# Patient Record
Sex: Female | Born: 1985
Health system: Southern US, Community
[De-identification: ages and names within clinical notes are randomized; demographics above are authoritative.]

## PROBLEM LIST (undated history)

## (undated) ENCOUNTER — Inpatient Hospital Stay (HOSPITAL_COMMUNITY): Payer: Self-pay

## (undated) DIAGNOSIS — T4145XA Adverse effect of unspecified anesthetic, initial encounter: Secondary | ICD-10-CM

## (undated) DIAGNOSIS — T8859XA Other complications of anesthesia, initial encounter: Secondary | ICD-10-CM

## (undated) DIAGNOSIS — F418 Other specified anxiety disorders: Secondary | ICD-10-CM

## (undated) DIAGNOSIS — G43909 Migraine, unspecified, not intractable, without status migrainosus: Secondary | ICD-10-CM

## (undated) DIAGNOSIS — R87629 Unspecified abnormal cytological findings in specimens from vagina: Secondary | ICD-10-CM

## (undated) HISTORY — DX: Migraine, unspecified, not intractable, without status migrainosus: G43.909

## (undated) HISTORY — PX: WISDOM TOOTH EXTRACTION: SHX21

## (undated) HISTORY — DX: Other specified anxiety disorders: F41.8

---

## 2013-11-30 LAB — OB RESULTS CONSOLE ABO/RH: RH TYPE: POSITIVE

## 2013-11-30 LAB — OB RESULTS CONSOLE ANTIBODY SCREEN: Antibody Screen: NEGATIVE

## 2013-11-30 LAB — OB RESULTS CONSOLE RUBELLA ANTIBODY, IGM: Rubella: IMMUNE

## 2013-11-30 LAB — OB RESULTS CONSOLE GC/CHLAMYDIA
CHLAMYDIA, DNA PROBE: NEGATIVE
Gonorrhea: NEGATIVE

## 2013-11-30 LAB — OB RESULTS CONSOLE HEPATITIS B SURFACE ANTIGEN: Hepatitis B Surface Ag: NEGATIVE

## 2013-11-30 LAB — OB RESULTS CONSOLE HIV ANTIBODY (ROUTINE TESTING): HIV: NONREACTIVE

## 2013-12-28 NOTE — L&D Delivery Note (Signed)
Patient was C/C/+1 and pushed for 60 minutes with epidural.   NSVD female infant, Apgars 9/9, weight pending.   The patient had no lacerations. Fundus was firm. EBL was expected. Placenta was delivered intact. Vagina was clear.  Baby was vigorous and doing skin to skin with mother.  Allyn Kenner

## 2014-05-09 ENCOUNTER — Encounter (HOSPITAL_COMMUNITY): Payer: Self-pay | Admitting: *Deleted

## 2014-05-09 ENCOUNTER — Inpatient Hospital Stay (HOSPITAL_COMMUNITY)
Admission: AD | Admit: 2014-05-09 | Discharge: 2014-05-09 | Disposition: A | Discharge status: Home / Self Care | Payer: BC Managed Care – PPO | Source: Ambulatory Visit | Attending: Obstetrics & Gynecology | Admitting: Obstetrics & Gynecology

## 2014-05-09 DIAGNOSIS — O4700 False labor before 37 completed weeks of gestation, unspecified trimester: Secondary | ICD-10-CM | POA: Insufficient documentation

## 2014-05-09 HISTORY — DX: Unspecified abnormal cytological findings in specimens from vagina: R87.629

## 2014-05-09 HISTORY — DX: Other complications of anesthesia, initial encounter: T88.59XA

## 2014-05-09 HISTORY — DX: Adverse effect of unspecified anesthetic, initial encounter: T41.45XA

## 2014-05-09 LAB — URINALYSIS, ROUTINE W REFLEX MICROSCOPIC
Bilirubin Urine: NEGATIVE
GLUCOSE, UA: NEGATIVE mg/dL
Hgb urine dipstick: NEGATIVE
Ketones, ur: NEGATIVE mg/dL
LEUKOCYTES UA: NEGATIVE
NITRITE: NEGATIVE
PROTEIN: NEGATIVE mg/dL
Specific Gravity, Urine: 1.015 (ref 1.005–1.030)
Urobilinogen, UA: 0.2 mg/dL (ref 0.0–1.0)
pH: 7 (ref 5.0–8.0)

## 2014-05-09 MED ORDER — OXYCODONE-ACETAMINOPHEN 5-325 MG PO TABS
1.0000 | ORAL_TABLET | Freq: Once | ORAL | Status: AC
  Administered 2014-05-09: 1 via ORAL
  Filled 2014-05-09: qty 1

## 2014-05-09 MED ORDER — NIFEDIPINE 10 MG PO CAPS
10.0000 mg | ORAL_CAPSULE | Freq: Once | ORAL | Status: AC
  Administered 2014-05-09: 10 mg via ORAL
  Filled 2014-05-09: qty 1

## 2014-05-09 MED ORDER — OXYCODONE-ACETAMINOPHEN 5-325 MG PO TABS: 2.0000 | ORAL_TABLET | ORAL | Status: DC | PRN

## 2014-05-09 MED ORDER — NIFEDIPINE 10 MG PO CAPS: 10.0000 mg | ORAL_CAPSULE | ORAL | Status: DC | PRN

## 2014-05-09 MED ORDER — PROMETHAZINE HCL 25 MG PO TABS
25.0000 mg | ORAL_TABLET | Freq: Once | ORAL | Status: AC
  Administered 2014-05-09: 25 mg via ORAL
  Filled 2014-05-09: qty 1

## 2014-05-09 MED ORDER — HYDROXYZINE HCL 25 MG PO TABS: 25.0000 mg | ORAL_TABLET | Freq: Four times a day (QID) | ORAL | Status: DC

## 2014-05-09 NOTE — MAU Provider Note (Signed)
Discussed case with NP and I agree with above  Sanjuana Kava

## 2014-05-09 NOTE — MAU Note (Signed)
States she has been having contractions for weeks, but usually they stop. Today, contractions started this AM around 0800 and have not subsided. States contractions do not hurt that bad, rates a "3."

## 2014-05-09 NOTE — MAU Provider Note (Signed)
  History     CSN: [id number]  Arrival date and time: 05/09/14 1508   First Provider Initiated Contact with Patient 05/09/14 1600      Chief Complaint  Patient presents with  . Contractions   HPI Comments: Lulie Hurd 28 y.o. [redacted]w[redacted]d  [id number] presents to MAU with contractions every 3-5 minutes since 8 am. She has been having them off and on for days. 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 She denies any LOF or vaginal bleeding.      Past Medical History  Diagnosis Date  . Vaginal Pap smear, abnormal   . Complication of anesthesia     ? "problem with epidural"  back pain    Past Surgical History  Procedure Laterality Date  . No past surgeries      History reviewed. No pertinent family history.  History  Substance Use Topics  . Smoking status: Never Smoker   . Smokeless tobacco: Never Used  . Alcohol Use: No    Allergies: No Known Allergies  No prescriptions prior to admission    Review of Systems  Constitutional: Negative.   HENT: Negative.   Eyes: Negative.   Respiratory: Negative.   Cardiovascular: Negative.   Gastrointestinal: Positive for abdominal pain.  Genitourinary: Negative.   Musculoskeletal: Negative.   Skin: Negative.   Neurological: Negative.   Psychiatric/Behavioral: Negative.    Physical Exam   Blood pressure 139/83, pulse 100, temperature 98.8 F (37.1 C), temperature source Oral, resp. rate 18.  Physical Exam  Constitutional: She is oriented to person, place, and time. She appears well-developed and well-nourished. No distress.  HENT:  Head: Normocephalic and atraumatic.  Eyes: Pupils are equal, round, and reactive to light.  GI: Soft. There is tenderness.  Genitourinary:  Cervix closed and 50 %  Neurological: She is alert and oriented to person, place, and time.  Skin: Skin is warm and dry.  Psychiatric: She has a normal mood and affect. Her behavior is normal. Judgment and thought content normal.    MAU  Course  Procedures  MDM  Percocet/ phenergan po/ not helpful Spoke with Dr Alwyn Pea who advised GBS and give patient option of Procardia and discharged to home or admit and IVF and stadol. Patient asked if we could do one dose of procardia and see if it helped. Patient decided she would go home with procardia. Discussed again with Dr Alwyn Pea who advised she could take procardia 20 mg q30 min to 1 hour for preterm labor. She is to call office tomorrow for visit and possible NST.     Assessment and Plan   A: Preterm labor  P: Procardia 10 mg two tabs q30 min to 1 hours to settle down contractions Vistaril 25 mg po q6 hours prn  Percocet 5-325 mg for pain # 6   Call office tomorrow for visit and possible NST  Olegario Messier 05/09/2014, 4:10 PM

## 2014-05-09 NOTE — Discharge Instructions (Signed)
Preterm Labor Information Preterm labor is when labor starts at less than 37 weeks of pregnancy. The normal length of a pregnancy is 39 to 41 weeks. CAUSES Often, there is no identifiable underlying cause as to why a woman goes into preterm labor. One of the most common known causes of preterm labor is infection. Infections of the uterus, cervix, vagina, amniotic sac, bladder, kidney, or even the lungs (pneumonia) can cause labor to start. Other suspected causes of preterm labor include:   Urogenital infections, such as yeast infections and bacterial vaginosis.   Uterine abnormalities (uterine shape, uterine septum, fibroids, or bleeding from the placenta).   A cervix that has been operated on (it may fail to stay closed).   Malformations in the fetus.   Multiple gestations (twins, triplets, and so on).   Breakage of the amniotic sac.  RISK FACTORS  Having a previous history of preterm labor.   Having premature rupture of membranes (PROM).   Having a placenta that covers the opening of the cervix (placenta previa).   Having a placenta that separates from the uterus (placental abruption).   Having a cervix that is too weak to hold the fetus in the uterus (incompetent cervix).   Having too much fluid in the amniotic sac (polyhydramnios).   Taking illegal drugs or smoking while pregnant.   Not gaining enough weight while pregnant.   Being younger than 18 and older than 28 years old.   Having a low socioeconomic status.   Being African American. SYMPTOMS Signs and symptoms of preterm labor include:   Menstrual-like cramps, abdominal pain, or back pain.  Uterine contractions that are regular, as frequent as six in an hour, regardless of their intensity (may be mild or painful).  Contractions that start on the top of the uterus and spread down to the lower abdomen and back.   A sense of increased pelvic pressure.   A watery or bloody mucus discharge that  comes from the vagina.  TREATMENT Depending on the length of the pregnancy and other circumstances, your health care provider may suggest bed rest. If necessary, there are medicines that can be given to stop contractions and to mature the fetal lungs. If labor happens before 34 weeks of pregnancy, a prolonged hospital stay may be recommended. Treatment depends on the condition of both you and the fetus.  WHAT SHOULD YOU DO IF YOU THINK YOU ARE IN PRETERM LABOR? Call your health care provider right away. You will need to go to the hospital to get checked immediately. HOW CAN YOU PREVENT PRETERM LABOR IN FUTURE PREGNANCIES? You should:   Stop smoking if you smoke.  Maintain healthy weight gain and avoid chemicals and drugs that are not necessary.  Be watchful for any type of infection.  Inform your health care provider if you have a known history of preterm labor. Document Released: 03/05/2004 Document Revised: 08/16/2013 Document Reviewed: 01/16/2013 [employer] Patient Information 2014 [employer].    

## 2014-05-12 LAB — CULTURE, BETA STREP (GROUP B ONLY)

## 2014-05-22 LAB — OB RESULTS CONSOLE GBS: STREP GROUP B AG: NEGATIVE

## 2014-06-14 ENCOUNTER — Inpatient Hospital Stay (HOSPITAL_COMMUNITY)
Admission: AD | Admit: 2014-06-14 | Discharge: 2014-06-16 | DRG: 775 | Disposition: A | Discharge status: Home / Self Care | Payer: BC Managed Care – PPO | Source: Ambulatory Visit | Attending: Obstetrics and Gynecology | Admitting: Obstetrics and Gynecology

## 2014-06-14 ENCOUNTER — Encounter (HOSPITAL_COMMUNITY): Payer: Self-pay | Admitting: *Deleted

## 2014-06-14 DIAGNOSIS — Z3490 Encounter for supervision of normal pregnancy, unspecified, unspecified trimester: Secondary | ICD-10-CM

## 2014-06-14 LAB — CBC
HCT: 36.1 % (ref 36.0–46.0)
Hemoglobin: 12.1 g/dL (ref 12.0–15.0)
MCH: 27.2 pg (ref 26.0–34.0)
MCHC: 33.5 g/dL (ref 30.0–36.0)
MCV: 81.1 fL (ref 78.0–100.0)
PLATELETS: 227 10*3/uL (ref 150–400)
RBC: 4.45 MIL/uL (ref 3.87–5.11)
RDW: 13.5 % (ref 11.5–15.5)
WBC: 15.4 10*3/uL — ABNORMAL HIGH (ref 4.0–10.5)

## 2014-06-14 MED ORDER — PHENYLEPHRINE 40 MCG/ML (10ML) SYRINGE FOR IV PUSH (FOR BLOOD PRESSURE SUPPORT)
80.0000 ug | PREFILLED_SYRINGE | INTRAVENOUS | Status: DC | PRN
  Filled 2014-06-14: qty 10
  Filled 2014-06-14: qty 2

## 2014-06-14 MED ORDER — ZOLPIDEM TARTRATE 5 MG PO TABS: 5.0000 mg | ORAL_TABLET | Freq: Every evening | ORAL | Status: DC | PRN

## 2014-06-14 MED ORDER — BUTORPHANOL TARTRATE 1 MG/ML IJ SOLN
1.0000 mg | INTRAMUSCULAR | Status: DC | PRN
  Administered 2014-06-15: 1 mg via INTRAVENOUS
  Filled 2014-06-14: qty 1

## 2014-06-14 MED ORDER — OXYTOCIN BOLUS FROM INFUSION
500.0000 mL | INTRAVENOUS | Status: DC
Start: 2014-06-14 — End: 2014-06-15
  Administered 2014-06-15: 500 mL via INTRAVENOUS

## 2014-06-14 MED ORDER — EPHEDRINE 5 MG/ML INJ
10.0000 mg | INTRAVENOUS | Status: DC | PRN
  Filled 2014-06-14: qty 4
  Filled 2014-06-14: qty 2

## 2014-06-14 MED ORDER — OXYCODONE-ACETAMINOPHEN 5-325 MG PO TABS
1.0000 | ORAL_TABLET | ORAL | Status: DC | PRN
  Administered 2014-06-15: 1 via ORAL
  Filled 2014-06-14: qty 1

## 2014-06-14 MED ORDER — IBUPROFEN 600 MG PO TABS
600.0000 mg | ORAL_TABLET | Freq: Four times a day (QID) | ORAL | Status: DC | PRN
  Administered 2014-06-15: 600 mg via ORAL
  Filled 2014-06-14: qty 1

## 2014-06-14 MED ORDER — ACETAMINOPHEN 325 MG PO TABS: 650.0000 mg | ORAL_TABLET | ORAL | Status: DC | PRN

## 2014-06-14 MED ORDER — FENTANYL 2.5 MCG/ML BUPIVACAINE 1/10 % EPIDURAL INFUSION (WH - ANES)
14.0000 mL/h | INTRAMUSCULAR | Status: DC | PRN
  Administered 2014-06-15: 14 mL/h via EPIDURAL
  Filled 2014-06-14 (×2): qty 125

## 2014-06-14 MED ORDER — EPHEDRINE 5 MG/ML INJ
10.0000 mg | INTRAVENOUS | Status: DC | PRN
  Filled 2014-06-14: qty 2

## 2014-06-14 MED ORDER — TERBUTALINE SULFATE 1 MG/ML IJ SOLN: 0.2500 mg | Freq: Once | INTRAMUSCULAR | Status: AC | PRN

## 2014-06-14 MED ORDER — PHENYLEPHRINE 40 MCG/ML (10ML) SYRINGE FOR IV PUSH (FOR BLOOD PRESSURE SUPPORT)
80.0000 ug | PREFILLED_SYRINGE | INTRAVENOUS | Status: DC | PRN
  Filled 2014-06-14: qty 2

## 2014-06-14 MED ORDER — LACTATED RINGERS IV SOLN
500.0000 mL | Freq: Once | INTRAVENOUS | Status: AC
  Administered 2014-06-15: 500 mL via INTRAVENOUS

## 2014-06-14 MED ORDER — CITRIC ACID-SODIUM CITRATE 334-500 MG/5ML PO SOLN: 30.0000 mL | ORAL | Status: DC | PRN

## 2014-06-14 MED ORDER — DIPHENHYDRAMINE HCL 50 MG/ML IJ SOLN: 12.5000 mg | INTRAMUSCULAR | Status: DC | PRN

## 2014-06-14 MED ORDER — LACTATED RINGERS IV SOLN: 500.0000 mL | INTRAVENOUS | Status: DC | PRN

## 2014-06-14 MED ORDER — ONDANSETRON HCL 4 MG/2ML IJ SOLN: 4.0000 mg | Freq: Four times a day (QID) | INTRAMUSCULAR | Status: DC | PRN

## 2014-06-14 MED ORDER — LACTATED RINGERS IV SOLN
INTRAVENOUS | Status: DC
  Administered 2014-06-15 (×2): via INTRAVENOUS

## 2014-06-14 MED ORDER — OXYTOCIN 40 UNITS IN LACTATED RINGERS INFUSION - SIMPLE MED: 62.5000 mL/h | INTRAVENOUS | Status: DC

## 2014-06-14 MED ORDER — MISOPROSTOL 25 MCG QUARTER TABLET
25.0000 ug | ORAL_TABLET | ORAL | Status: DC | PRN
  Administered 2014-06-14: 25 ug via VAGINAL
  Filled 2014-06-14: qty 1
  Filled 2014-06-14: qty 0.25

## 2014-06-14 MED ORDER — LIDOCAINE HCL (PF) 1 % IJ SOLN
30.0000 mL | INTRAMUSCULAR | Status: DC | PRN
  Filled 2014-06-14: qty 30

## 2014-06-14 NOTE — Progress Notes (Signed)
Dr. Ouida Sills notified that pt's prenatal record not in [place], requests secretary check in [place], no orders for OB panel at this time

## 2014-06-14 NOTE — H&P (Signed)
Nicole Meza is an 28 y.o.white female [id number] [redacted]w[redacted]d who was seen in the office today for a routine PN visit. She had an NST because she was 40+ wks. On the NST there was a late decel. Given her parity and gest age she was sent to the hosp for induction. PNC was otherwise uncomplicated. She had a normal first trimester screen. Chief Complaint: HPI:  Past Medical History  Diagnosis Date  . Vaginal Pap smear, abnormal   . Complication of anesthesia     ? "problem with epidural"  back pain    Past Surgical History  Procedure Laterality Date  . No past surgeries    . Wisdom tooth extraction      History reviewed. No pertinent family history. Social History:  reports that she has never smoked. She has never used smokeless tobacco. She reports that she does not drink alcohol or use illicit drugs.  Allergies: No Known Allergies  Medications Prior to Admission  Medication Sig Dispense Refill  . Prenatal Vit-Fe Fumarate-FA (PRENATAL MULTIVITAMIN) TABS tablet Take 1 tablet by mouth daily at 12 noon.           Blood pressure 128/79, pulse 85, temperature 98.1 F (36.7 C), temperature source Oral, resp. rate 16, height 5' 2.5" (1.588 m), weight 81.647 kg (180 lb). Abdomen: soft, non-tender; bowel sounds normal; no masses,  no organomegaly and Gravid Pelvic: Cx-50/1/-2 vtx   Lab Results  Component Value Date   WBC 15.4* 06/14/2014   HGB 12.1 06/14/2014   HCT 36.1 06/14/2014   MCV 81.1 06/14/2014   PLT 227 06/14/2014   No results found for this basename: PREGTESTUR, PREGSERUM, HCG, HCGQUANT     Assessment/Plan  Patient Active Problem List   Diagnosis Date Noted  . Pregnancy 06/14/2014  IMP/ IUP at term with late decel on NST Plan/ Will admit and start induction  Sherelle Castelli E 06/14/2014, 9:31 PM

## 2014-06-15 ENCOUNTER — Encounter (HOSPITAL_COMMUNITY): Payer: Self-pay | Admitting: Anesthesiology

## 2014-06-15 ENCOUNTER — Inpatient Hospital Stay (HOSPITAL_COMMUNITY): Payer: BC Managed Care – PPO | Admitting: Anesthesiology

## 2014-06-15 ENCOUNTER — Encounter (HOSPITAL_COMMUNITY): Payer: BC Managed Care – PPO | Admitting: Anesthesiology

## 2014-06-15 LAB — RPR

## 2014-06-15 MED ORDER — ONDANSETRON HCL 4 MG/2ML IJ SOLN: 4.0000 mg | INTRAMUSCULAR | Status: DC | PRN

## 2014-06-15 MED ORDER — PRENATAL MULTIVITAMIN CH
1.0000 | ORAL_TABLET | Freq: Every day | ORAL | Status: DC
  Administered 2014-06-16: 1 via ORAL
  Filled 2014-06-15: qty 1

## 2014-06-15 MED ORDER — OXYTOCIN 40 UNITS IN LACTATED RINGERS INFUSION - SIMPLE MED
1.0000 m[IU]/min | INTRAVENOUS | Status: DC
  Filled 2014-06-15: qty 1000

## 2014-06-15 MED ORDER — DIBUCAINE 1 % RE OINT: 1.0000 "application " | TOPICAL_OINTMENT | RECTAL | Status: DC | PRN

## 2014-06-15 MED ORDER — OXYCODONE-ACETAMINOPHEN 5-325 MG PO TABS
1.0000 | ORAL_TABLET | ORAL | Status: DC | PRN
  Administered 2014-06-15 – 2014-06-16 (×2): 1 via ORAL
  Filled 2014-06-15 (×2): qty 1

## 2014-06-15 MED ORDER — LIDOCAINE HCL (PF) 1 % IJ SOLN
INTRAMUSCULAR | Status: DC | PRN
  Administered 2014-06-15 (×2): 8 mL

## 2014-06-15 MED ORDER — SENNOSIDES-DOCUSATE SODIUM 8.6-50 MG PO TABS
2.0000 | ORAL_TABLET | ORAL | Status: DC
  Administered 2014-06-16: 2 via ORAL
  Filled 2014-06-15: qty 2

## 2014-06-15 MED ORDER — TERBUTALINE SULFATE 1 MG/ML IJ SOLN: 0.2500 mg | Freq: Once | INTRAMUSCULAR | Status: DC | PRN

## 2014-06-15 MED ORDER — ONDANSETRON HCL 4 MG PO TABS: 4.0000 mg | ORAL_TABLET | ORAL | Status: DC | PRN

## 2014-06-15 MED ORDER — ZOLPIDEM TARTRATE 5 MG PO TABS: 5.0000 mg | ORAL_TABLET | Freq: Every evening | ORAL | Status: DC | PRN

## 2014-06-15 MED ORDER — IBUPROFEN 600 MG PO TABS
600.0000 mg | ORAL_TABLET | Freq: Four times a day (QID) | ORAL | Status: DC
  Administered 2014-06-15 – 2014-06-16 (×4): 600 mg via ORAL
  Filled 2014-06-15 (×4): qty 1

## 2014-06-15 MED ORDER — FENTANYL 2.5 MCG/ML BUPIVACAINE 1/10 % EPIDURAL INFUSION (WH - ANES)
INTRAMUSCULAR | Status: DC | PRN
  Administered 2014-06-15: 14 mL/h via EPIDURAL

## 2014-06-15 MED ORDER — DIPHENHYDRAMINE HCL 25 MG PO CAPS: 25.0000 mg | ORAL_CAPSULE | Freq: Four times a day (QID) | ORAL | Status: DC | PRN

## 2014-06-15 MED ORDER — TETANUS-DIPHTH-ACELL PERTUSSIS 5-2.5-18.5 LF-MCG/0.5 IM SUSP
0.5000 mL | Freq: Once | INTRAMUSCULAR | Status: AC
  Administered 2014-06-16: 0.5 mL via INTRAMUSCULAR

## 2014-06-15 MED ORDER — WITCH HAZEL-GLYCERIN EX PADS: 1.0000 "application " | MEDICATED_PAD | CUTANEOUS | Status: DC | PRN

## 2014-06-15 MED ORDER — LANOLIN HYDROUS EX OINT: TOPICAL_OINTMENT | CUTANEOUS | Status: DC | PRN

## 2014-06-15 MED ORDER — BENZOCAINE-MENTHOL 20-0.5 % EX AERO: 1.0000 "application " | INHALATION_SPRAY | CUTANEOUS | Status: DC | PRN

## 2014-06-15 MED ORDER — SIMETHICONE 80 MG PO CHEW: 80.0000 mg | CHEWABLE_TABLET | ORAL | Status: DC | PRN

## 2014-06-15 NOTE — Anesthesia Postprocedure Evaluation (Signed)
Anesthesia Post Note  Patient: Nicole Meza  Procedure(s) Performed: * No procedures listed *  Anesthesia type: Epidural  Patient location: Mother/Baby  Post pain: Pain level controlled  Post assessment: Post-op Vital signs reviewed  Last Vitals:  Filed Vitals:   06/15/14 1145  BP: 117/66  Pulse: 87  Temp: 37.2 C  Resp: 19    Post vital signs: Reviewed  Level of consciousness:alert  Complications: No apparent anesthesia complications

## 2014-06-15 NOTE — Lactation Note (Signed)
This note was copied from the chart of Nicole Meza. Lactation Consultation Note   Initial consult with this mom and baby, now 20 hours old. Baby is term, and not wanting to feed. Mom reports baby at breast after birth mostly slept, few sucks. Mom has very wide spaced breast with little breast tissue. She has a history of low milk supply. i attempted to wake baby to latch, but he slept once STS. I started mom pumping with DEP in premie setting, and she expressed 2 mls of colostrum. I showed parents how to cup fed. Baby did well, and then was eager to latch. H latched to both breasts, and suckled for about 10 minutes.  Mom knows to pump if baby does not feed after a few hours, but if baby latching and feeding on cue, pumping not needed at this time. Breast  Feeding teaching done from the baby and me book . Lactation services reviewed with mom.   Patient Name: Nicole Kathye Cipriani [id number] Date: 06/15/2014 Reason for consult: Initial assessment   Maternal Data Formula Feeding for Exclusion: No Infant to breast within first hour of birth: Yes Has patient been taught Hand Expression?: Yes Does the patient have breastfeeding experience prior to this delivery?: Yes  Feeding Feeding Type: Breast Milk Length of feed: 10 min  LATCH Score/Interventions Latch: Grasps breast easily, tongue down, lips flanged, rhythmical sucking. (sleepy before cup feeding, eager after)  Audible Swallowing: None  Type of Nipple: Everted at rest and after stimulation  Comfort (Breast/Nipple): Soft / non-tender (wide spaced breasts with cone shaped breasts, little brest tissue - mom had low milk supply in past)     Hold (Positioning): Assistance needed to correctly position infant at breast and maintain latch. Intervention(s): Breastfeeding basics reviewed;Support Pillows;Position options;Skin to skin  LATCH Score: 7  Lactation Tools Discussed/Used Tools: Pump Breast pump type: Double-Electric Breast Pump WIC  Program: No Pump Review: Setup, frequency, and cleaning;Milk Storage;Other (comment) (premie setting, hnd expression) Initiated by:: clee rn lc Date initiated:: 06/15/14 (at 1600)   Consult Status Consult Status: Follow-up Date: 06/16/14 Follow-up type: In-patient    Tonna Corner 06/15/2014, 4:22 PM

## 2014-06-15 NOTE — Progress Notes (Signed)
Using rope

## 2014-06-15 NOTE — Progress Notes (Signed)
[facility]ctors office called.  Office will fax prenatal labs

## 2014-06-15 NOTE — Progress Notes (Signed)
Unit secretary attempted to log into [employer] to obtain prenatal labs.  Secretary does not have access [employer].Dr Rogue Bussing notified and requested MD obtain prenatal labs for hospital

## 2014-06-15 NOTE — Anesthesia Preprocedure Evaluation (Signed)
Anesthesia Evaluation  Patient identified by MRN, date of birth, ID band Patient awake    Reviewed: Allergy & Precautions, H&P , Patient's Chart, lab work & pertinent test results  Airway Mallampati: I TM Distance: >3 FB Neck ROM: full    Dental no notable dental hx.    Pulmonary neg pulmonary ROS,    Pulmonary exam normal       Cardiovascular negative cardio ROS      Neuro/Psych negative neurological ROS  negative psych ROS   GI/Hepatic negative GI ROS, Neg liver ROS,   Endo/Other  negative endocrine ROS  Renal/GU negative Renal ROS     Musculoskeletal   Abdominal Normal abdominal exam  (+)   Peds  Hematology negative hematology ROS (+)   Anesthesia Other Findings   Reproductive/Obstetrics (+) Pregnancy                           Anesthesia Physical Anesthesia Plan  ASA: II  Anesthesia Plan: Epidural   Post-op Pain Management:    Induction:   Airway Management Planned:   Additional Equipment:   Intra-op Plan:   Post-operative Plan:   Informed Consent: I have reviewed the patients History and Physical, chart, labs and discussed the procedure including the risks, benefits and alternatives for the proposed anesthesia with the patient or authorized representative who has indicated his/her understanding and acceptance.     Plan Discussed with:   Anesthesia Plan Comments:         Anesthesia Quick Evaluation

## 2014-06-15 NOTE — Anesthesia Procedure Notes (Signed)
Epidural Patient location during procedure: OB Start time: 06/15/2014 3:03 AM End time: 06/15/2014 3:07 AM  Staffing Anesthesiologist: Lyn Hollingshead Performed by: anesthesiologist   Preanesthetic Checklist Completed: patient identified, surgical consent, pre-op evaluation, timeout performed, IV checked, risks and benefits discussed and monitors and equipment checked  Epidural Patient position: sitting Prep: site prepped and draped and DuraPrep Patient monitoring: continuous pulse ox and blood pressure Approach: midline Location: L3-L4 Injection technique: LOR air  Needle:  Needle type: Tuohy  Needle gauge: 17 G Needle length: 9 cm and 9 Needle insertion depth: 6 cm Catheter type: closed end flexible Catheter size: 19 Gauge Catheter at skin depth: 10 cm Test dose: negative and Other  Assessment Sensory level: T9 Events: blood not aspirated, injection not painful, no injection resistance, negative IV test and no paresthesia  Additional Notes Reason for block:procedure for pain

## 2014-06-16 LAB — CBC
HCT: 29.4 % — ABNORMAL LOW (ref 36.0–46.0)
HEMOGLOBIN: 9.6 g/dL — AB (ref 12.0–15.0)
MCH: 26.5 pg (ref 26.0–34.0)
MCHC: 32.3 g/dL (ref 30.0–36.0)
MCV: 82.1 fL (ref 78.0–100.0)
PLATELETS: 164 10*3/uL (ref 150–400)
RBC: 3.58 MIL/uL — AB (ref 3.87–5.11)
RDW: 13.3 % (ref 11.5–15.5)
WBC: 14.6 10*3/uL — AB (ref 4.0–10.5)

## 2014-06-16 NOTE — Discharge Summary (Signed)
Obstetric Discharge Summary Reason for Admission: induction of labor, decel noted on NST at >40weeks Prenatal Procedures: ultrasound Intrapartum Procedures: spontaneous vaginal delivery Postpartum Procedures: none Complications-Operative and Postpartum: none Hemoglobin  Date Value Ref Range Status  06/16/2014 9.6* 12.0 - 15.0 g/dL Final     REPEATED TO VERIFY     DELTA CHECK NOTED     HCT  Date Value Ref Range Status  06/16/2014 29.4* 36.0 - 46.0 % Final    Physical Exam:  General: alert and cooperative Lochia: appropriate Uterine Fundus: firm DVT Evaluation: No evidence of DVT seen on physical exam.  Discharge Diagnoses: Term Pregnancy-delivered  Discharge Information: Date: 06/16/2014 Activity: pelvic rest Diet: routine Medications: PNV and Ibuprofen Condition: stable Instructions: refer to practice specific booklet Discharge to: home Follow-up Information   Follow up with Tiras Bianchini, DO In 4 weeks.   Specialty:  Obstetrics and Gynecology   Contact information:   [address] [place] [address] [phone]       Newborn Data: Live born female  Birth Weight: 8 lb 2.2 oz (3691 g) APGAR: 9, 9  Home with mother.  Allyn Kenner 06/16/2014, 1:05 PM

## 2014-06-21 ENCOUNTER — Ambulatory Visit (HOSPITAL_COMMUNITY)
Admit: 2014-06-21 | Discharge: 2014-06-21 | Disposition: A | Discharge status: Home / Self Care | Payer: BC Managed Care – PPO

## 2014-06-21 NOTE — Lactation Note (Signed)
Adult Lactation Consultation Outpatient Visit Note  Patient Name: Nicole Meza     BABY: Windy Kalata Date of Birth: [birth year]                    DOB: [birth year] Gestational Age at Delivery: 56.2      BIRTH WEIGHT: 8-2.2 Type of Delivery: NVD                       DISCHARGE WEIGHT: 7-13                                                            WEIGHT TODAY:7-8.4 Breastfeeding History: Frequency of Breastfeeding: every 2-3 hours Length of Feeding: 5-40 Voids: 4-5 Stools: 3+ yellow seedy  Supplementing / Method: Pumping:  Type of Pump:symphony   Frequency:every 2-3 hours  Volume:  5-30  Comments:    Consultation Evaluation: Mom and 50 day old infant here for feeding assessment.  Mom has a history of low milk supply with previous babies.  Mom states it has been stressful since discharge because baby is fussy and hungry.  Baby is at times to upset to latch until he is given 15 mls of expressed breast milk.  Baby did latch well in office and nursed actively for 20 minutes.  Discussed using a SNS with mom but she is not interested at this time. Plan is for mom to feed on cue, post pump 15-20 minutes after feeding and to supplement breastfeeds with 50-60 mls of expressed milk/formula increase as tolerated.  Initial Feeding : Pre-feed [id number] Post-feed [id number] Amount Transferred:8 ml Comments:  Additional Feeding Assessment: Pre-feed Weight: Post-feed Weight: Amount Transferred: Comments:  Additional Feeding Assessment: Pre-feed Weight: Post-feed Weight: Amount Transferred: Comments:  Total Breast milk Transferred this Visit: 8 mls Total Supplement Given: 60 mls  Additional Interventions:   Follow-Up7/3/15 1 00pm      Franki Monte 06/21/2014, 10:01 AM

## 2014-06-29 ENCOUNTER — Ambulatory Visit (HOSPITAL_COMMUNITY): Payer: BC Managed Care – PPO

## 2014-10-29 ENCOUNTER — Encounter (HOSPITAL_COMMUNITY): Payer: Self-pay | Admitting: Anesthesiology

## 2016-10-20 LAB — HM PAP SMEAR

## 2017-09-08 DIAGNOSIS — N930 Postcoital and contact bleeding: Secondary | ICD-10-CM | POA: Diagnosis not present

## 2017-09-09 MED FILL — NORG-ETHIN ESTRA 0.25-0.035: 0.25-35 | 84 days supply | Qty: 84 | Fill #0

## 2017-09-15 ENCOUNTER — Other Ambulatory Visit: Payer: Self-pay | Admitting: Obstetrics and Gynecology

## 2017-09-15 DIAGNOSIS — N6314 Unspecified lump in the right breast, lower inner quadrant: Secondary | ICD-10-CM

## 2017-09-21 ENCOUNTER — Ambulatory Visit
Admission: RE | Admit: 2017-09-21 | Discharge: 2017-09-21 | Disposition: A | Discharge status: Home / Self Care | Payer: 59 | Source: Ambulatory Visit | Attending: Obstetrics and Gynecology | Admitting: Obstetrics and Gynecology

## 2017-09-21 DIAGNOSIS — R928 Other abnormal and inconclusive findings on diagnostic imaging of breast: Secondary | ICD-10-CM | POA: Diagnosis not present

## 2017-09-21 DIAGNOSIS — N6314 Unspecified lump in the right breast, lower inner quadrant: Secondary | ICD-10-CM

## 2017-09-21 DIAGNOSIS — N6489 Other specified disorders of breast: Secondary | ICD-10-CM | POA: Diagnosis not present

## 2017-09-21 LAB — HM MAMMOGRAPHY

## 2017-10-07 DIAGNOSIS — N930 Postcoital and contact bleeding: Secondary | ICD-10-CM | POA: Diagnosis not present

## 2017-10-11 ENCOUNTER — Telehealth: Payer: 59 | Admitting: Family

## 2017-10-11 DIAGNOSIS — J029 Acute pharyngitis, unspecified: Secondary | ICD-10-CM | POA: Diagnosis not present

## 2017-10-11 MED ORDER — PREDNISONE 5 MG PO TABS: 5.0000 mg | ORAL_TABLET | ORAL | 0 refills | Status: DC

## 2017-10-11 MED FILL — predniSONE 5 MG (21) TBPK: 5 | 6 days supply | Qty: 21 | Fill #0

## 2017-10-11 NOTE — Progress Notes (Signed)
Thank you for the details you included in the comment boxes. Those details are very helpful in determining the best course of treatment for you and help [place] to provide the best care.  We are sorry that you are not feeling well.  Here is how we plan to help!  Based on your presentation I believe you most likely have A cough due to a virus.  This is called viral bronchitis and is best treated by rest, plenty of fluids and control of the cough.  You may use Ibuprofen or Tylenol as directed to help your symptoms.     In addition you may use A non-prescription cough medication called Mucinex DM: take 2 tablets every 12 hours.  Sterapred 5 mg dosepak  From your responses in the eVisit questionnaire you describe inflammation in the upper respiratory tract which is causing a significant cough.  This is commonly called Bronchitis and has four common causes:    Allergies  Viral Infections  Acid Reflux  Bacterial Infection Allergies, viruses and acid reflux are treated by controlling symptoms or eliminating the cause. An example might be a cough caused by taking certain blood pressure medications. You stop the cough by changing the medication. Another example might be a cough caused by acid reflux. Controlling the reflux helps control the cough.  USE OF BRONCHODILATOR ("RESCUE") INHALERS: There is a risk from using your bronchodilator too frequently.  The risk is that over-reliance on a medication which only relaxes the muscles surrounding the breathing tubes can reduce the effectiveness of medications prescribed to reduce swelling and congestion of the tubes themselves.  Although you feel brief relief from the bronchodilator inhaler, your asthma may actually be worsening with the tubes becoming more swollen and filled with mucus.  This can delay other crucial treatments, such as oral steroid medications. If you need to use a bronchodilator inhaler daily, several times per day, you should discuss this with  your provider.  There are probably better treatments that could be used to keep your asthma under control.     HOME CARE . Only take medications as instructed by your medical team. . Complete the entire course of an antibiotic. . Drink plenty of fluids and get plenty of rest. . Avoid close contacts especially the very young and the elderly . Cover your mouth if you cough or cough into your sleeve. . Always remember to wash your hands . A steam or ultrasonic humidifier can help congestion.   GET HELP RIGHT AWAY IF: . You develop worsening fever. . You become short of breath . You cough up blood. . Your symptoms persist after you have completed your treatment plan MAKE SURE YOU   Understand these instructions.  Will watch your condition.  Will get help right away if you are not doing well or get worse.  Your e-visit answers were reviewed by a board certified advanced clinical practitioner to complete your personal care plan.  Depending on the condition, your plan could have included both over the counter or prescription medications. If there is a problem please reply  once you have received a response from your provider. Your safety is important to [place].  If you have drug allergies check your prescription carefully.    You can use MyChart to ask questions about today's visit, request a non-urgent call back, or ask for a work or school excuse for 24 hours related to this e-Visit. If it has been greater than 24 hours you will need to  follow up with your provider, or enter a new e-Visit to address those concerns. You will get an e-mail in the next two days asking about your experience.  I hope that your e-visit has been valuable and will speed your recovery. Thank you for using e-visits.   

## 2017-10-22 DIAGNOSIS — N898 Other specified noninflammatory disorders of vagina: Secondary | ICD-10-CM | POA: Diagnosis not present

## 2017-10-22 DIAGNOSIS — Z01419 Encounter for gynecological examination (general) (routine) without abnormal findings: Secondary | ICD-10-CM | POA: Diagnosis not present

## 2017-10-22 DIAGNOSIS — Z3202 Encounter for pregnancy test, result negative: Secondary | ICD-10-CM | POA: Diagnosis not present

## 2017-10-22 DIAGNOSIS — Z124 Encounter for screening for malignant neoplasm of cervix: Secondary | ICD-10-CM | POA: Diagnosis not present

## 2017-10-22 LAB — HM PAP SMEAR: HM Pap smear: NEGATIVE

## 2017-10-22 MED FILL — FLUCONAZOLE 150 MG TABLET: 150 | 3 days supply | Qty: 2 | Fill #0

## 2017-10-22 MED FILL — metroNIDAZOLE 500 MG TABS: 500 | 7 days supply | Qty: 14 | Fill #0

## 2017-11-24 MED FILL — NORG-ETHIN ESTRA 0.25-0.035: 0.25-35 | 84 days supply | Qty: 84 | Fill #1

## 2017-12-07 ENCOUNTER — Telehealth: Payer: 59 | Admitting: Nurse Practitioner

## 2017-12-07 DIAGNOSIS — N760 Acute vaginitis: Secondary | ICD-10-CM | POA: Diagnosis not present

## 2017-12-07 DIAGNOSIS — B9689 Other specified bacterial agents as the cause of diseases classified elsewhere: Secondary | ICD-10-CM | POA: Diagnosis not present

## 2017-12-07 MED ORDER — METRONIDAZOLE 500 MG PO TABS: 500.0000 mg | ORAL_TABLET | Freq: Two times a day (BID) | ORAL | 0 refills | Status: DC

## 2017-12-07 NOTE — Progress Notes (Signed)

## 2017-12-08 MED FILL — metroNIDAZOLE 500 MG TABS: 500 | 7 days supply | Qty: 14 | Fill #0

## 2017-12-27 ENCOUNTER — Telehealth: Payer: 59 | Admitting: Family

## 2017-12-27 DIAGNOSIS — H109 Unspecified conjunctivitis: Secondary | ICD-10-CM | POA: Diagnosis not present

## 2017-12-27 MED ORDER — MOXIFLOXACIN HCL 0.5 % OP SOLN: 1.0000 [drp] | Freq: Three times a day (TID) | OPHTHALMIC | 0 refills | Status: DC

## 2017-12-27 MED FILL — MOXIFLOXACIN 0.5% EYE DROPS: 0.5 | 10 days supply | Qty: 3 | Fill #0

## 2017-12-27 NOTE — Progress Notes (Signed)

## 2018-01-01 DIAGNOSIS — H5213 Myopia, bilateral: Secondary | ICD-10-CM | POA: Diagnosis not present

## 2018-02-28 MED FILL — ESTARYLLA 0.25-35 MG-MCG TA: 0.25-35 | 84 days supply | Qty: 84 | Fill #2

## 2018-03-01 ENCOUNTER — Encounter: Payer: Self-pay | Admitting: Family Medicine

## 2018-03-01 ENCOUNTER — Ambulatory Visit (INDEPENDENT_AMBULATORY_CARE_PROVIDER_SITE_OTHER): Payer: 59 | Admitting: Family Medicine

## 2018-03-01 VITALS — BP 109/75 | HR 77 | Temp 97.6°F | Ht 62.5 in | Wt 163.8 lb

## 2018-03-01 DIAGNOSIS — F418 Other specified anxiety disorders: Secondary | ICD-10-CM | POA: Diagnosis not present

## 2018-03-01 DIAGNOSIS — K625 Hemorrhage of anus and rectum: Secondary | ICD-10-CM | POA: Diagnosis not present

## 2018-03-01 DIAGNOSIS — K909 Intestinal malabsorption, unspecified: Secondary | ICD-10-CM | POA: Diagnosis not present

## 2018-03-01 DIAGNOSIS — Z7689 Persons encountering health services in other specified circumstances: Secondary | ICD-10-CM

## 2018-03-01 DIAGNOSIS — R197 Diarrhea, unspecified: Secondary | ICD-10-CM

## 2018-03-01 LAB — CBC
HCT: 44.2 % (ref 36.0–46.0)
Hemoglobin: 15.3 g/dL — ABNORMAL HIGH (ref 12.0–15.0)
MCHC: 34.6 g/dL (ref 30.0–36.0)
MCV: 88 fl (ref 78.0–100.0)
PLATELETS: 255 10*3/uL (ref 150.0–400.0)
RBC: 5.02 Mil/uL (ref 3.87–5.11)
RDW: 12.5 % (ref 11.5–15.5)
WBC: 5.5 10*3/uL (ref 4.0–10.5)

## 2018-03-01 LAB — TSH: TSH: 1.75 u[IU]/mL (ref 0.35–4.50)

## 2018-03-01 LAB — SEDIMENTATION RATE: SED RATE: 1 mm/h (ref 0–20)

## 2018-03-01 LAB — VITAMIN D 25 HYDROXY (VIT D DEFICIENCY, FRACTURES): VITD: 39.26 ng/mL (ref 30.00–100.00)

## 2018-03-01 LAB — T4, FREE: FREE T4: 0.83 ng/dL (ref 0.60–1.60)

## 2018-03-01 LAB — T3, FREE: T3 FREE: 3.2 pg/mL (ref 2.3–4.2)

## 2018-03-01 LAB — C-REACTIVE PROTEIN: CRP: 0.3 mg/dL — AB (ref 0.5–20.0)

## 2018-03-01 MED ORDER — LORAZEPAM 0.5 MG PO TABS
0.5000 mg | ORAL_TABLET | ORAL | 2 refills | Status: DC | PRN
Start: 2018-03-01 — End: 2019-12-12

## 2018-03-01 NOTE — Progress Notes (Signed)
Patient ID: Nicole Meza, female  DOB: [birth year], 32 y.o.   MRN: [id number] Patient Care Team    Relationship Specialty Notifications Start End  Ma Hillock, DO PCP - General Family Medicine  03/01/18   Harlen Labs, MD Referring Physician Optometry  03/01/18     Chief Complaint  Patient presents with  . Establish Care    Routine Chronic Condition    Subjective:  Nicole Meza is a 32 y.o.  female present for new patient establishment. All past medical history, surgical history, allergies, family history, immunizations, medications and social history were obtained in the electronic medical record today. All recent labs, ED visits and hospitalizations within the last year were reviewed.  Depression with anxiety: Patient reports she has had depression and anxiety issues for a few years. He was tried on many SSRIs, in which she states she has a very difficult time tolerating. She is currently in counseling with Doreene Adas at [facility]. She reports she feels like she is in a good place with her depression and anxiety. Changing jobs has helped a great deal. She was able to come off all the medications with the exception of lorazepam 0.5 mg daily when necessary. She reports she uses lorazepam maybe once or twice a month. She is asking for refills on this medication.  IBS:  Patient reports she has had diarrhea for over a year. She states she can have up to 12 loose stools a week and sometimes go days without having a stool. 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 She endorses not watching her diet closely over the 1 months and she feels that her symptoms are worsening. She has never tried medications for her diarrhea/IBS-like symptoms. She has not tried probiotic use. He endorses 1-2 times of occasional bright red blood per rectum with bowel movements. She states this is where he  usually felt to be secondary to hemorrhoid. She is requesting a GI referral and vitamin D check.  Depression screen PHQ 2/9 03/01/2018  Decreased Interest 0  Down, Depressed, Hopeless 0  PHQ - 2 Score 0  Altered sleeping 1  Tired, decreased energy 1  Change in appetite 0  Feeling bad or failure about yourself  1  Trouble concentrating 0  Moving slowly or fidgety/restless 0  Suicidal thoughts 0  PHQ-9 Score 3  Difficult doing work/chores Not difficult at all   GAD 7 : Generalized Anxiety Score 03/01/2018  Nervous, Anxious, on Edge 1  Control/stop worrying 3  Worry too much - different things 3  Trouble relaxing 3  Restless 1  Easily annoyed or irritable 3  Afraid - awful might happen 3  Total GAD 7 Score 17  Anxiety Difficulty Not difficult at all      Current Exercise Habits: Home exercise routine;Structured exercise class, Type of exercise: treadmill;Other - see comments, Time (Minutes): 60, Frequency (Times/Week): 5, Weekly Exercise (Minutes/Week): 300, Intensity: Moderate   No flowsheet data found.   Immunization History  Administered Date(s) Administered  . Tdap 06/16/2014    No exam data present  Past Medical History:  Diagnosis Date  . Complication of anesthesia    ? "problem with epidural"  back pain  . Depression with anxiety   . Migraine   . Vaginal Pap smear, abnormal    No Known Allergies Past Surgical History:  Procedure Laterality Date  .  WISDOM TOOTH EXTRACTION     Family History  Problem Relation Age of Onset  . Asthma Son   . Hyperlipidemia Maternal Grandmother   . Hypertension Maternal Grandmother   . Diabetes Maternal Grandfather   . Hyperlipidemia Maternal Grandfather    Social History   Socioeconomic History  . Marital status: Married    Spouse name: Not on file  . Number of children: 3  . Years of education: doctorate  . Highest education level: Not on file  Social Needs  . Financial resource strain: Not on file  . Food  insecurity - worry: Not on file  . Food insecurity - inability: Not on file  . Transportation needs - medical: Not on file  . Transportation needs - non-medical: Not on file  Occupational History  . Occupation: pharmacist  Tobacco Use  . Smoking status: Never Smoker  . Smokeless tobacco: Never Used  Substance and Sexual Activity  . Alcohol use: No  . Drug use: No  . Sexual activity: Yes    Birth control/protection: Pill  Other Topics Concern  . Not on file  Social History Narrative   Married. 3 children.    Doctorate degree, works as a Software engineer at [employer].    Wears a bike helmet and seat belt.    Smoke alarm in the home.    Firearms locked in the home.    Feels safe in her relationships.       Allergies as of 03/01/2018   No Known Allergies     Medication List        Accurate as of 03/01/18 12:33 PM. Always use your most recent med list.          ESTARYLLA PO Take by mouth.   LORazepam 0.5 MG tablet Commonly known as:  ATIVAN Take 1 tablet (0.5 mg total) by mouth as needed for anxiety.       All past medical history, surgical history, allergies, family history, immunizations andmedications were updated in the EMR today and reviewed under the history and medication portions of their EMR.    No results found for this or any previous visit (from the past 2160 hour(s)).  US Breast Ltd Uni Right Inc Axilla  Result Date: 09/21/2017 CLINICAL DATA:  The patient's physician feels a lump in the lower inner quadrant of the right breast and the patient feels burning. EXAM: 2D DIGITAL DIAGNOSTIC BILATERAL MAMMOGRAM WITH CAD AND ADJUNCT TOMO ULTRASOUND RIGHT BREAST COMPARISON:  Previous exam(s). ACR Breast Density Category b: There are scattered areas of fibroglandular density. FINDINGS: No suspicious masses, calcifications, or distortion seen in either breast. Mammographic images were processed with CAD. On physical exam, no suspicious lumps are identified. Targeted ultrasound  is performed, showing no sonographic abnormalities. IMPRESSION: No mammographic or sonographic evidence of malignancy. RECOMMENDATION: Treatment of the patient's symptoms should be based on clinical and physical exam given the lack of imaging findings. Recommend annual screening mammography beginning at the age of 26. I have discussed the findings and recommendations with the patient. Results were also provided in writing at the conclusion of the visit. If applicable, a reminder letter will be sent to the patient regarding the next appointment. BI-RADS CATEGORY  1: Negative. Electronically Signed   By: Dorise Bullion III M.D   On: 09/21/2017 11:23   Mm Diag Breast Tomo Bilateral  Result Date: 09/21/2017 CLINICAL DATA:  The patient's physician feels a lump in the lower inner quadrant of the right breast and the patient  feels burning. EXAM: 2D DIGITAL DIAGNOSTIC BILATERAL MAMMOGRAM WITH CAD AND ADJUNCT TOMO ULTRASOUND RIGHT BREAST COMPARISON:  Previous exam(s). ACR Breast Density Category b: There are scattered areas of fibroglandular density. FINDINGS: No suspicious masses, calcifications, or distortion seen in either breast. Mammographic images were processed with CAD. On physical exam, no suspicious lumps are identified. Targeted ultrasound is performed, showing no sonographic abnormalities. IMPRESSION: No mammographic or sonographic evidence of malignancy. RECOMMENDATION: Treatment of the patient's symptoms should be based on clinical and physical exam given the lack of imaging findings. Recommend annual screening mammography beginning at the age of 59. I have discussed the findings and recommendations with the patient. Results were also provided in writing at the conclusion of the visit. If applicable, a reminder letter will be sent to the patient regarding the next appointment. BI-RADS CATEGORY  1: Negative. Electronically Signed   By: Dorise Bullion III M.D   On: 09/21/2017 11:23     ROS: 14 pt review  of systems performed and negative (unless mentioned in an HPI)  Objective: BP 109/75 (BP Location: Left Arm, Patient Position: Sitting, Cuff Size: Large)   Pulse 77   Temp 97.6 F (36.4 C) (Oral)   Ht 5' 2.5" (1.588 m)   Wt 163 lb 12.8 oz (74.3 kg)   LMP 02/17/2018   SpO2 99%   BMI 29.48 kg/m  Gen: Afebrile. No acute distress. Nontoxic in appearance, well-developed, well-nourished,  Pleasant caucasian female.  HENT: AT. .  MMM, no oral lesions, adequate dentition.  Eyes:Pupils Equal Round Reactive to light, Extraocular movements intact,  Conjunctiva without redness, discharge or icterus. Neck/lymp/endocrine: Supple,no lymphadenopathy, no thyromegaly CV: RRR no murmur, no edema, +2/4 P posterior tibialis pulses.  Chest: CTAB, no wheeze, rhonchi or crackles.  Abd: Soft. NTND. BS presnet. no Masses palpated. No hepatosplenomegaly. No rebound tenderness or guarding. Skin: no rashes, purpura or petechiae. Warm and well-perfused. Skin intact. Neuro/Msk:  Normal gait. PERLA. EOMi. Alert. Oriented x3 Psych: Normal affect, dress and demeanor. Normal speech. Normal thought content and judgment.   Assessment/plan: Nicole Meza is a 32 y.o. female present for new patient establishment.  Depression with anxiety - PHQ (3) and GAD (17) assessment tool completed today. Pt is in counseling and taking ativan 0.5 mg QD PRN very infrequently. Although score suggest otherwise, pt feels she is in a good place with her anxiety.  - [place] database reviewed and appropriate. Controlled substance contract signed.  - Ativan 0.5 Mg QD PRN #30, 2 refills provided. Face-to-face encounter needed in 6 months if requesting refills.  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 (s. [facility]) - F/U 6 months, sooner if needed.   Diarrhea, unspecified type/IBS/malabsorption possible/ BRBPR - Discussed in depth w/ pt today. Certainly has IBS-like  features. She endorses BRBPR only on a few occassions and felt it was hemorrhoid related.  - mild improvement when Dc'd gluten from diet. Do not feel a need to test today, she agrees. Avoid gluten. FODMAP. - agreed to test for inflammatory causes and thyroid causes to IBS-like symptoms. She has not tried medications. 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  - She has not been able to tolerate many SSRI in the past for anxiety. Uncertain if amitriptyline tried. She did not seem interested in trying medications.  - Consider ALIGN probiotics. She will think about it.  - Sedimentation rate -  C-reactive protein - Vitamin D (25 hydroxy) - CBC - TSH - T3, free - T4, free - If not a thyroid cause, will refer to GI. Pt seems to desire colonoscopy for reassurance.    Return if symptoms worsen or fail to improve.   Note is dictated utilizing voice recognition software. Although note has been proof read prior to signing, occasional typographical errors still can be missed. If any questions arise, please do not hesitate to call for verification.  Electronically signed by: Howard Pouch, DO [place]

## 2018-03-01 NOTE — Patient Instructions (Signed)
I refilled ativan. Follow every 6 months face to face if needing refills.   We will call you once all lab results received. If needed will refer to GI.   Please help [place] help you:  We are honored you have chosen [facility] for your Primary Care home. Below you will find basic instructions that you may need to access in the future. Please help [place] help you by reading the instructions, which cover many of the frequent questions we experience.   Prescription refills and request:  -In order to allow more efficient response time, please call your pharmacy for all refills. They will forward the request electronically to [place]. This allows for the quickest possible response. Request left on a nurse line can take longer to refill, since these are checked as time allows between office patients and other phone calls.  - refill request can take up to 3-5 working days to complete.  - If request is sent electronically and request is appropiate, it is usually completed in 1-2 business days.  - all patients will need to be seen routinely for all chronic medical conditions requiring prescription medications (see follow-up below). If you are overdue for follow up on your condition, you will be asked to make an appointment and we will call in enough medication to cover you until your appointment (up to 30 days).  - all controlled substances will require a face to face visit to request/refill.  - if you desire your prescriptions to go through a new pharmacy, and have an active script at original pharmacy, you will need to call your pharmacy and have scripts transferred to new pharmacy. This is completed between the pharmacy locations and not by your provider.    Results: If any images or labs were ordered, it can take up to 1 week to get results depending on the test ordered and the lab/facility running and resulting the test. - Normal or stable results, which do not need further discussion, may be released to your  mychart immediately with attached note to you. A call may not be generated for normal results. Please make certain to sign up for mychart. If you have questions on how to activate your mychart you can call the front office.  - If your results need further discussion, our office will attempt to contact you via phone, and if unable to reach you after 2 attempts, we will release your abnormal result to your mychart with instructions.  - All results will be automatically released in mychart after 1 week.  - Your provider will provide you with explanation and instruction on all relevant material in your results. Please keep in mind, results and labs may appear confusing or abnormal to the untrained eye, but it does not mean they are actually abnormal for you personally. If you have any questions about your results that are not covered, or you desire more detailed explanation than what was provided, you should make an appointment with your provider to do so.   Our office handles many outgoing and incoming calls daily. If we have not contacted you within 1 week about your results, please check your mychart to see if there is a message first and if not, then contact our office.  In helping with this matter, you help decrease call volume, and therefore allow [place] to be able to respond to patients needs more efficiently.   Acute office visits (sick visit):  An acute visit is intended for a new problem  and are scheduled in shorter time slots to allow schedule openings for patients with new problems. This is the appropriate visit to discuss a new problem. In order to provide you with excellent quality medical care with proper time for you to explain your problem, have an exam and receive treatment with instructions, these appointments should be limited to one new problem per visit. If you experience a new problem, in which you desire to be addressed, please make an acute office visit, we save openings on the schedule to  accommodate you. Please do not save your new problem for any other type of visit, let us take care of it properly and quickly for you.   Follow up visits:  Depending on your condition(s) your provider will need to see you routinely in order to provide you with quality care and prescribe medication(s). Most chronic conditions (Example: hypertension, Diabetes, depression/anxiety... etc), require visits a couple times a year. Your provider will instruct you on proper follow up for your personal medical conditions and history. Please make certain to make follow up appointments for your condition as instructed. Failing to do so could result in lapse in your medication treatment/refills. If you request a refill, and are overdue to be seen on a condition, we will always provide you with a 30 day script (once) to allow you time to schedule.    Medicare wellness (well visit): - we have a wonderful Nurse Maudie Mercury), that will meet with you and provide you will yearly medicare wellness visits. These visits should occur yearly (can not be scheduled less than 1 calendar year apart) and cover preventive health, immunizations, advance directives and screenings you are entitled to yearly through your medicare benefits. Do not miss out on your entitled benefits, this is when medicare will pay for these benefits to be ordered for you.  These are strongly encouraged by your provider and is the appropriate type of visit to make certain you are up to date with all preventive health benefits. If you have not had your medicare wellness exam in the last 12 months, please make certain to schedule one by calling the office and schedule your medicare wellness with Maudie Mercury as soon as possible.   Yearly physical (well visit):  - Adults are recommended to be seen yearly for physicals. Check with your insurance and date of your last physical, most insurances require one calendar year between physicals. Physicals include all preventive health  topics, screenings, medical exam and labs that are appropriate for gender/age and history. You may have fasting labs needed at this visit. This is a well visit (not a sick visit), new problems should not be covered during this visit (see acute visit).  - Pediatric patients are seen more frequently when they are younger. Your provider will advise you on well child visit timing that is appropriate for your their age. - This is not a medicare wellness visit. Medicare wellness exams do not have an exam portion to the visit. Some medicare companies allow for a physical, some do not allow a yearly physical. If your medicare allows a yearly physical you can schedule the medicare wellness with our nurse Maudie Mercury and have your physical with your provider after, on the same day. Please check with insurance for your full benefits.   Late Policy/No Shows:  - all new patients should arrive 15-30 minutes earlier than appointment to allow [place] time  to  obtain all personal demographics,  insurance information and for you to complete office paperwork. -  All established patients should arrive 10-15 minutes earlier than appointment time to update all information and be checked in .  - In our best efforts to run on time, if you are late for your appointment you will be asked to either reschedule or if able, we will work you back into the schedule. There will be a wait time to work you back in the schedule,  depending on availability.  - If you are unable to make it to your appointment as scheduled, please call 24 hours ahead of time to allow [place] to fill the time slot with someone else who needs to be seen. If you do not cancel your appointment ahead of time, you may be charged a no show fee.

## 2018-03-02 ENCOUNTER — Encounter: Payer: Self-pay | Admitting: Family Medicine

## 2018-03-02 ENCOUNTER — Telehealth: Payer: Self-pay | Admitting: Family Medicine

## 2018-03-02 ENCOUNTER — Encounter: Payer: Self-pay | Admitting: Gastroenterology

## 2018-03-02 DIAGNOSIS — R197 Diarrhea, unspecified: Secondary | ICD-10-CM

## 2018-03-02 DIAGNOSIS — K625 Hemorrhage of anus and rectum: Secondary | ICD-10-CM

## 2018-03-02 NOTE — Telephone Encounter (Signed)
Referral placed.

## 2018-04-13 ENCOUNTER — Encounter: Payer: Self-pay | Admitting: Gastroenterology

## 2018-04-13 ENCOUNTER — Ambulatory Visit (INDEPENDENT_AMBULATORY_CARE_PROVIDER_SITE_OTHER): Payer: 59 | Admitting: Gastroenterology

## 2018-04-13 VITALS — BP 112/74 | HR 78 | Ht 62.5 in | Wt 164.1 lb

## 2018-04-13 DIAGNOSIS — R197 Diarrhea, unspecified: Secondary | ICD-10-CM

## 2018-04-13 DIAGNOSIS — R12 Heartburn: Secondary | ICD-10-CM | POA: Diagnosis not present

## 2018-04-13 DIAGNOSIS — R1013 Epigastric pain: Secondary | ICD-10-CM | POA: Diagnosis not present

## 2018-04-13 DIAGNOSIS — R140 Abdominal distension (gaseous): Secondary | ICD-10-CM

## 2018-04-13 DIAGNOSIS — K625 Hemorrhage of anus and rectum: Secondary | ICD-10-CM | POA: Diagnosis not present

## 2018-04-13 MED ORDER — SUPREP BOWEL PREP KIT 17.5-3.13-1.6 GM/177ML PO SOLN: 1.0000 | ORAL | 0 refills | Status: DC

## 2018-04-13 MED ORDER — OMEPRAZOLE 20 MG PO CPDR: 20.0000 mg | DELAYED_RELEASE_CAPSULE | Freq: Every day | ORAL | 0 refills | Status: DC

## 2018-04-13 MED FILL — OMEPRAZOLE 20 MG CAP: 20 | 30 days supply | Qty: 30 | Fill #0

## 2018-04-13 MED FILL — SUPREP BOWEL PREP KIT: 17.5-3.13-1 | 2 days supply | Qty: 354 | Fill #0

## 2018-04-13 NOTE — Patient Instructions (Addendum)
If you are age 32 or older, your body mass index should be between 23-30. Your Body mass index is 29.54 kg/m. If this is out of the aforementioned range listed, please consider follow up with your Primary Care Provider.  If you are age 34 or younger, your body mass index should be between 19-25. Your Body mass index is 29.54 kg/m. If this is out of the aformentioned range listed, please consider follow up with your Primary Care Provider.   We have sent the following medications to your pharmacy for you to pick up at your convenience: Omeprazole 20mg  daily  We have given you samples of the following medication to take: Fiber Choice 1-2 tablets daily.   Please have lab work done after 2 weeks of gluten challenge.   You have been scheduled for a colonoscopy. Please follow written instructions given to you at your visit today.  Please pick up your prep supplies at the pharmacy within the next 1-3 days. If you use inhalers (even only as needed), please bring them with you on the day of your procedure. Your physician has requested that you go to www.[email or web address].com and enter the access code given to you at your visit today. This web site gives a general overview about your procedure. However, you should still follow specific instructions given to you by our office regarding your preparation for the procedure.  You have been scheduled for an endoscopy. Please follow written instructions given to you at your visit today. If you use inhalers (even only as needed), please bring them with you on the day of your procedure. Your physician has requested that you go to www.[email or web address].com and enter the access code given to you at your visit today. This web site gives a general overview about your procedure. However, you should still follow specific instructions given to you by our office regarding your preparation for the procedure.

## 2018-04-13 NOTE — Progress Notes (Signed)
Nicole Meza    [id number]    06/15/1986  Primary Care Physician:Kuneff, Reinaldo Raddle, DO  Referring Physician: Ma Hillock, DO [address] [place], [place] [address]  Chief complaint: Diarrhea, rectal bleeding, bloating, abdominal pain  HPI: 32 year old female here for new patient visit with complaints of intermittent diarrhea associated with abdominal bloating and epigastric abdominal discomfort for past few years on and off.  It was worse during pregnancy and in the postpartum.  Improved for some time in between and recently has been getting progressively worse.  She also has occasional bright red blood per rectum mostly when she wipes after having multiple bowel movements.  Denies any vomiting but has intermittent nausea.  Recently started started having heartburn associated with epigastric abdominal pain, started taking Tums with some improvement.  Denies any NSAIDs. Feels her symptoms are worse when she eats gluten and is trying to avoid it.  Did not notice any changes with other diets.  She also tries to eat lactose-free diet. Denies any melena. Mom has IBS Aunt on maternal side had rectal cancer   Outpatient Encounter Medications as of 04/13/2018  Medication Sig  . LORazepam (ATIVAN) 0.5 MG tablet Take 1 tablet (0.5 mg total) by mouth as needed for anxiety.  Donnetta Hail Estradiol (ESTARYLLA PO) Take by mouth.   No facility-administered encounter medications on file as of 04/13/2018.     Allergies as of 04/13/2018  . (No Known Allergies)    Past Medical History:  Diagnosis Date  . Complication of anesthesia    ? "problem with epidural"  back pain  . Depression with anxiety   . Migraine   . Vaginal Pap smear, abnormal     Past Surgical History:  Procedure Laterality Date  . WISDOM TOOTH EXTRACTION      Family History  Problem Relation Age of Onset  . Asthma Son   . Hyperlipidemia Maternal Grandmother   . Hypertension Maternal Grandmother     . Diabetes Maternal Grandfather   . Hyperlipidemia Maternal Grandfather   . Rectal cancer Maternal Aunt   . Breast cancer Maternal Aunt     Social History   Socioeconomic History  . Marital status: Married    Spouse name: Not on file  . Number of children: 3  . Years of education: doctorate  . Highest education level: Not on file  Occupational History  . Occupation: Software engineer  Social Needs  . Financial resource strain: Not on file  . Food insecurity:    Worry: Not on file    Inability: Not on file  . Transportation needs:    Medical: Not on file    Non-medical: Not on file  Tobacco Use  . Smoking status: Never Smoker  . Smokeless tobacco: Never Used  Substance and Sexual Activity  . Alcohol use: No  . Drug use: No  . Sexual activity: Yes    Birth control/protection: Pill  Lifestyle  . Physical activity:    Days per week: Not on file    Minutes per session: Not on file  . Stress: Not on file  Relationships  . Social connections:    Talks on phone: Not on file    Gets together: Not on file    Attends religious service: Not on file    Active member of club or organization: Not on file    Attends meetings of clubs or organizations: Not on file    Relationship  status: Not on file  . Intimate partner violence:    Fear of current or ex partner: Not on file    Emotionally abused: Not on file    Physically abused: Not on file    Forced sexual activity: Not on file  Other Topics Concern  . Not on file  Social History Narrative   Married. 3 children.    Doctorate degree, works as a Software engineer at [employer].    Wears a bike helmet and seat belt.    Smoke alarm in the home.    Firearms locked in the home.    Feels safe in her relationships.          Review of systems: Review of Systems  Constitutional: Negative for fever and chills.  HENT: Negative.   Eyes: Negative for blurred vision.  Respiratory: Negative for cough, shortness of breath and wheezing.    Cardiovascular: Negative for chest pain and palpitations.  Gastrointestinal: as per HPI Genitourinary: Negative for dysuria, urgency, frequency and hematuria.  Musculoskeletal: Negative for myalgias, back pain and joint pain.  Skin: Negative for itching and rash.  Neurological: Negative for dizziness, tremors, focal weakness, seizures and loss of consciousness.  Endo/Heme/Allergies: Negative for seasonal allergies.  Psychiatric/Behavioral: Negative for depression, suicidal ideas and hallucinations.  Positive for anxiety All other systems reviewed and are negative.   Physical Exam: Vitals:   04/13/18 1419  BP: 112/74  Pulse: 78   Body mass index is 29.54 kg/m. Gen:      No acute distress HEENT:  EOMI, sclera anicteric Neck:     No masses; no thyromegaly Lungs:    Clear to auscultation bilaterally; normal respiratory effort CV:         Regular rate and rhythm; no murmurs Abd:      + bowel sounds; soft, non-tender; no palpable masses, no distension Ext:    No edema; adequate peripheral perfusion Skin:      Warm and dry; no rash Neuro: alert and oriented x 3 Psych: normal mood and affect  Data Reviewed:  Reviewed labs, radiology imaging, old records and pertinent past GI work up   Assessment and Plan/Recommendations:  32 year old female here with complaints of intermittent diarrhea associated with bloating, epigastric abdominal pain and occasional bright red blood per rectum  We will schedule for EGD and colonoscopy for evaluation Patient does think she has gluten sensitivity Will obtain duodenal biopsies to exclude celiac disease Will also challenge patient with gluten and check TTG IgA antibody in 2 weeks  Intermittent heartburn likely secondary to GERD: Trial of low-dose PPI, omeprazole 20 mg daily  Irritable bowel syndrome with alternating constipation and diarrhea Advised patient to increase fluid intake Fiberchoice tablets 1-2 daily  Return in 2-3 months or  sooner if needed  Greater than 50% of the time used for counseling as well as treatment plan and follow-up. She had multiple questions which were answered to her satisfaction  K. Denzil Magnuson , MD [phone]    CC: Raoul Pitch, Renee A, DO

## 2018-04-19 ENCOUNTER — Encounter: Payer: Self-pay | Admitting: Gastroenterology

## 2018-04-19 ENCOUNTER — Ambulatory Visit (AMBULATORY_SURGERY_CENTER): Payer: 59 | Admitting: Gastroenterology

## 2018-04-19 VITALS — BP 104/69 | HR 62 | Temp 99.3°F | Resp 12 | Ht 62.0 in | Wt 164.0 lb

## 2018-04-19 DIAGNOSIS — R197 Diarrhea, unspecified: Secondary | ICD-10-CM | POA: Diagnosis present

## 2018-04-19 DIAGNOSIS — K625 Hemorrhage of anus and rectum: Secondary | ICD-10-CM

## 2018-04-19 DIAGNOSIS — R1013 Epigastric pain: Secondary | ICD-10-CM

## 2018-04-19 MED ORDER — SODIUM CHLORIDE 0.9 % IV SOLN: 500.0000 mL | Freq: Once | INTRAVENOUS | Status: DC

## 2018-04-19 NOTE — Op Note (Signed)
[facility] Patient Name: Nicole Meza Procedure Date: 04/19/2018 2:38 PM MRN: [id number] Endoscopist: Mauri Pole , MD Age: 32 Referring MD:  Date of Birth: September 09, 1986 Gender: Female Account #: [id number] Procedure:                Colonoscopy Indications:              Evaluation of unexplained GI bleeding, Clinically                            significant diarrhea of unexplained origin Medicines:                Monitored Anesthesia Care Procedure:                Pre-Anesthesia Assessment:                           - Prior to the procedure, a History and Physical                            was performed, and patient medications and                            allergies were reviewed. The patient's tolerance of                            previous anesthesia was also reviewed. The risks                            and benefits of the procedure and the sedation                            options and risks were discussed with the patient.                            All questions were answered, and informed consent                            was obtained. Prior Anticoagulants: The patient has                            taken no previous anticoagulant or antiplatelet                            agents. ASA Grade Assessment: II - A patient with                            mild systemic disease. After reviewing the risks                            and benefits, the patient was deemed in                            satisfactory condition to undergo the procedure.  After obtaining informed consent, the colonoscope                            was passed under direct vision. Throughout the                            procedure, the patient's blood pressure, pulse, and                            oxygen saturations were monitored continuously. The                            Model PCF-[id number]) scope was introduced                            through  the anus and advanced to the the terminal                            ileum, with identification of the appendiceal                            orifice and IC valve. The colonoscopy was performed                            without difficulty. The patient tolerated the                            procedure well. The quality of the bowel                            preparation was excellent. The terminal ileum,                            ileocecal valve, appendiceal orifice, and rectum                            were photographed. Scope In: 2:51:52 PM Scope Out: 3:07:42 PM Scope Withdrawal Time: 0 hours 9 minutes 14 seconds  Total Procedure Duration: 0 hours 15 minutes 50 seconds  Findings:                 The perianal and digital rectal examinations were                            normal.                           Normal mucosa was found in the entire colon.                            Biopsies were taken with a cold forceps for                            histology.  The terminal ileum appeared normal. Biopsies were                            taken with a cold forceps for histology.                           Non-bleeding internal hemorrhoids were found during                            retroflexion. The hemorrhoids were small.                           The exam was otherwise without abnormality. Complications:            No immediate complications. Estimated Blood Loss:     Estimated blood loss was minimal. Impression:               - Normal mucosa in the entire examined colon.                            Biopsied.                           - The examined portion of the ileum was normal.                            Biopsied.                           - Non-bleeding internal hemorrhoids.                           - The examination was otherwise normal. Recommendation:           - Patient has a contact number available for                            emergencies. The  signs and symptoms of potential                            delayed complications were discussed with the                            patient. Return to normal activities tomorrow.                            Written discharge instructions were provided to the                            patient.                           - Resume previous diet.                           - Continue present medications.                           -  Await pathology results.                           - Repeat colonoscopy date to be determined after                            pending pathology results are reviewed for                            surveillance based on pathology results. Mauri Pole, MD 04/19/2018 3:16:21 PM This report has been signed electronically.

## 2018-04-19 NOTE — Progress Notes (Signed)
To PACU, VSS. Report to [email or web address] 

## 2018-04-19 NOTE — Progress Notes (Signed)
Called to room to assist during endoscopic procedure.  Patient ID and intended procedure confirmed with present staff. Received instructions for my participation in the procedure from the performing physician.  

## 2018-04-19 NOTE — Progress Notes (Signed)
Pt's states no medical or surgical changes since previsit or office visit. 

## 2018-04-19 NOTE — Patient Instructions (Signed)
YOU HAD AN ENDOSCOPIC PROCEDURE TODAY: Refer to the procedure report and other information in the discharge instructions given to you for any specific questions about what was found during the examination. If this information does not answer your questions, please call [facility] office at [phone] to clarify.  ° °YOU SHOULD EXPECT: Some feelings of bloating in the abdomen. Passage of more gas than usual. Walking can help get rid of the air that was put into your GI tract during the procedure and reduce the bloating. If you had a lower endoscopy (such as a colonoscopy or flexible sigmoidoscopy) you may notice spotting of blood in your stool or on the toilet paper. Some abdominal soreness may be present for a day or two, also. ° °DIET: Your first meal following the procedure should be a light meal and then it is ok to progress to your normal diet. A half-sandwich or bowl of soup is an example of a good first meal. Heavy or fried foods are harder to digest and may make you feel nauseous or bloated. Drink plenty of fluids but you should avoid alcoholic beverages for 24 hours. If you had a esophageal dilation, please see attached instructions for diet.   ° °ACTIVITY: Your care partner should take you home directly after the procedure. You should plan to take it easy, moving slowly for the rest of the day. You can resume normal activity the day after the procedure however YOU SHOULD NOT DRIVE, use power tools, machinery or perform tasks that involve climbing or major physical exertion for 24 hours (because of the sedation medicines used during the test).  ° °SYMPTOMS TO REPORT IMMEDIATELY: °A gastroenterologist can be reached at any hour. Please call [phone]  for any of the following symptoms:  °Following lower endoscopy (colonoscopy, flexible sigmoidoscopy) °Excessive amounts of blood in the stool  °Significant tenderness, worsening of abdominal pains  °Swelling of the abdomen that is new, acute  °Fever of 100° or  higher  °Following upper endoscopy (EGD, EUS, ERCP, esophageal dilation) °Vomiting of blood or coffee ground material  °New, significant abdominal pain  °New, significant chest pain or pain under the shoulder blades  °Painful or persistently difficult swallowing  °New shortness of breath  °Black, tarry-looking or red, bloody stools ° °FOLLOW UP:  °If any biopsies were taken you will be contacted by phone or by letter within the next 1-3 weeks. Call [phone]  if you have not heard about the biopsies in 3 weeks.  °Please also call with any specific questions about appointments or follow up tests. ° °

## 2018-04-19 NOTE — Op Note (Signed)
[facility] Patient Name: Nicole Meza Procedure Date: 04/19/2018 2:38 PM MRN: [id number] Endoscopist: Mauri Pole , MD Age: 32 Referring MD:  Date of Birth: 1986/04/21 Gender: Female Account #: [id number] Procedure:                Upper GI endoscopy Indications:              Endoscopy to assess diarrhea in patient suspected                            of having disease of the small-bowel, Epigastric                            abdominal pain Medicines:                Monitored Anesthesia Care Procedure:                Pre-Anesthesia Assessment:                           - Prior to the procedure, a History and Physical                            was performed, and patient medications and                            allergies were reviewed. The patient's tolerance of                            previous anesthesia was also reviewed. The risks                            and benefits of the procedure and the sedation                            options and risks were discussed with the patient.                            All questions were answered, and informed consent                            was obtained. Prior Anticoagulants: The patient has                            taken no previous anticoagulant or antiplatelet                            agents. ASA Grade Assessment: II - A patient with                            mild systemic disease. After reviewing the risks                            and benefits, the patient was deemed in  satisfactory condition to undergo the procedure.                           After obtaining informed consent, the endoscope was                            passed under direct vision. Throughout the                            procedure, the patient's blood pressure, pulse, and                            oxygen saturations were monitored continuously. The                            Endoscope was introduced through  the mouth, and                            advanced to the second part of duodenum. The upper                            GI endoscopy was accomplished without difficulty.                            The patient tolerated the procedure well. Scope In: Scope Out: Findings:                 The Z-line was regular and was found 35 cm from the                            incisors.                           The examined esophagus was normal.                           The stomach was normal.                           Patchy mildly erythematous mucosa without active                            bleeding and with no stigmata of bleeding was found                            in the duodenal bulb.                           The second portion of the duodenum was normal.                            Biopsies for histology were taken with a cold                            forceps for evaluation of celiac disease. Complications:  No immediate complications. Estimated Blood Loss:     Estimated blood loss was minimal. Impression:               - Z-line regular, 35 cm from the incisors.                           - Normal esophagus.                           - Normal stomach.                           - Erythematous duodenopathy.                           - Normal second portion of the duodenum. Biopsied. Recommendation:           - Patient has a contact number available for                            emergencies. The signs and symptoms of potential                            delayed complications were discussed with the                            patient. Return to normal activities tomorrow.                            Written discharge instructions were provided to the                            patient.                           - Resume previous diet.                           - Continue present medications.                           - Await pathology results. Mauri Pole, MD 04/19/2018  3:14:25 PM This report has been signed electronically.

## 2018-04-20 ENCOUNTER — Telehealth: Payer: Self-pay

## 2018-04-20 NOTE — Telephone Encounter (Signed)
  Follow up Call-  Call back number 04/19/2018  Post procedure Call Back phone  # [phone]  Permission to leave phone message Yes  Some recent data might be hidden     Patient questions:  Do you have a fever, pain , or abdominal swelling? No. Pain Score  0 *  Have you tolerated food without any problems? Yes.    Have you been able to return to your normal activities? Yes.    Do you have any questions about your discharge instructions: Diet   No. Medications  No. Follow up visit  No.  Do you have questions or concerns about your Care? No.  Actions: * If pain score is 4 or above: No action needed, pain <4.

## 2018-04-29 ENCOUNTER — Encounter: Payer: Self-pay | Admitting: Gastroenterology

## 2018-05-25 MED FILL — ESTARYLLA 0.25-35 MG-MCG TA: 0.25-35 | 84 days supply | Qty: 84 | Fill #3

## 2018-06-10 ENCOUNTER — Encounter: Payer: 59 | Admitting: Family Medicine

## 2018-06-14 ENCOUNTER — Encounter: Payer: Self-pay | Admitting: Family Medicine

## 2018-06-14 ENCOUNTER — Telehealth: Payer: Self-pay | Admitting: Family Medicine

## 2018-06-14 ENCOUNTER — Ambulatory Visit (INDEPENDENT_AMBULATORY_CARE_PROVIDER_SITE_OTHER): Payer: 59 | Admitting: Family Medicine

## 2018-06-14 VITALS — BP 107/72 | HR 65 | Temp 98.2°F | Resp 20 | Ht 62.0 in | Wt 164.5 lb

## 2018-06-14 DIAGNOSIS — Z131 Encounter for screening for diabetes mellitus: Secondary | ICD-10-CM | POA: Diagnosis not present

## 2018-06-14 DIAGNOSIS — Z0000 Encounter for general adult medical examination without abnormal findings: Secondary | ICD-10-CM

## 2018-06-14 DIAGNOSIS — Z79899 Other long term (current) drug therapy: Secondary | ICD-10-CM

## 2018-06-14 DIAGNOSIS — Z130 Encounter for screening for diseases of the blood and blood-forming organs and certain disorders involving the immune mechanism: Secondary | ICD-10-CM | POA: Diagnosis not present

## 2018-06-14 DIAGNOSIS — E669 Obesity, unspecified: Secondary | ICD-10-CM

## 2018-06-14 LAB — COMPREHENSIVE METABOLIC PANEL
ALBUMIN: 4.4 g/dL (ref 3.5–5.2)
ALK PHOS: 34 U/L — AB (ref 39–117)
ALT: 11 U/L (ref 0–35)
AST: 10 U/L (ref 0–37)
BILIRUBIN TOTAL: 0.5 mg/dL (ref 0.2–1.2)
BUN: 14 mg/dL (ref 6–23)
CALCIUM: 9.5 mg/dL (ref 8.4–10.5)
CO2: 28 meq/L (ref 19–32)
CREATININE: 0.87 mg/dL (ref 0.40–1.20)
Chloride: 105 mEq/L (ref 96–112)
GFR: 80.3 mL/min (ref >60.00)
Glucose, Bld: 100 mg/dL — ABNORMAL HIGH (ref 70–99)
Potassium: 4.5 mEq/L (ref 3.5–5.1)
Sodium: 139 mEq/L (ref 135–145)
TOTAL PROTEIN: 6.8 g/dL (ref 6.0–8.3)

## 2018-06-14 LAB — LIPID PANEL
CHOLESTEROL: 167 mg/dL (ref 0–200)
HDL: 43.6 mg/dL (ref >39.00)
LDL Cholesterol: 106 mg/dL — ABNORMAL HIGH (ref 0–99)
NonHDL: 123.49
Total CHOL/HDL Ratio: 4
Triglycerides: 85 mg/dL (ref 0.0–149.0)
VLDL: 17 mg/dL (ref 0.0–40.0)

## 2018-06-14 LAB — CBC WITH DIFFERENTIAL/PLATELET
BASOS ABS: 0 10*3/uL (ref 0.0–0.1)
Basophils Relative: 0.5 % (ref 0.0–3.0)
Eosinophils Absolute: 0 10*3/uL (ref 0.0–0.7)
Eosinophils Relative: 0.6 % (ref 0.0–5.0)
HEMATOCRIT: 41.4 % (ref 36.0–46.0)
HEMOGLOBIN: 14.2 g/dL (ref 12.0–15.0)
LYMPHS PCT: 27.4 % (ref 12.0–46.0)
Lymphs Abs: 1.9 10*3/uL (ref 0.7–4.0)
MCHC: 34.4 g/dL (ref 30.0–36.0)
MCV: 86.7 fl (ref 78.0–100.0)
MONOS PCT: 6.3 % (ref 3.0–12.0)
Monocytes Absolute: 0.4 10*3/uL (ref 0.1–1.0)
NEUTROS ABS: 4.5 10*3/uL (ref 1.4–7.7)
Neutrophils Relative %: 65.2 % (ref 43.0–77.0)
PLATELETS: 215 10*3/uL (ref 150.0–400.0)
RBC: 4.77 Mil/uL (ref 3.87–5.11)
RDW: 12.5 % (ref 11.5–15.5)
WBC: 7 10*3/uL (ref 4.0–10.5)

## 2018-06-14 LAB — HEMOGLOBIN A1C: HEMOGLOBIN A1C: 5.2 % (ref 4.6–6.5)

## 2018-06-14 NOTE — Progress Notes (Signed)
Patient ID: Nicole Meza, female  DOB: [birth year], 32 y.o.   MRN: [id number] Patient Care Team    Relationship Specialty Notifications Start End  Ma Hillock, DO PCP - General Family Medicine  03/01/18   Harlen Labs, MD Referring Physician Optometry  03/01/18   Bobbye Charleston, MD Consulting Physician Obstetrics and Gynecology  06/14/18   Mauri Pole, MD Consulting Physician Gastroenterology  06/14/18     Chief Complaint  Patient presents with  . Annual Exam    Subjective:  Nicole Meza is a 32 y.o.  Female  present for CPE. All past medical history, surgical history, allergies, family history, immunizations, medications and social history were updated in the electronic medical record today. All recent labs, ED visits and hospitalizations within the last year were reviewed.  Health maintenance:  Colonoscopy: had colonoscopy/EGD for bleeding 04/19/2018. Internal hemorrhoid noted only.  follow-up at 35.  by Dr. Silverio Decamp Mammogram: routine screen at 32 , GYN: Dr. Philis Pique, had a mammogram 09/21/2017--> return to routine screens.  Cervical cancer screening: last pap: 10/22/2017, results: negative, completed by: Dr. Philis Pique.  Immunizations: tdap UTD 2015, Influenza UTD 2018 (encouraged yearly) Infectious disease screening: HIV completed 2014 DEXA: routine screen Assistive device: none Oxygen TRV:UYEB Patient has a Dental home. Hospitalizations/ED visits: none  Depression screen [facility] 2/9 06/14/2018 03/01/2018  Decreased Interest 0 0  Down, Depressed, Hopeless 0 0  PHQ - 2 Score 0 0  Altered sleeping - 1  Tired, decreased energy - 1  Change in appetite - 0  Feeling bad or failure about yourself  - 1  Trouble concentrating - 0  Moving slowly or fidgety/restless - 0  Suicidal thoughts - 0  PHQ-9 Score - 3  Difficult doing work/chores - Not difficult at all   GAD 7 : Generalized Anxiety Score 06/14/2018 03/01/2018  Nervous, Anxious, on Edge 0 1  Control/stop  worrying 0 3  Worry too much - different things 0 3  Trouble relaxing 0 3  Restless 0 1  Easily annoyed or irritable 0 3  Afraid - awful might happen 0 3  Total GAD 7 Score 0 17  Anxiety Difficulty - Not difficult at all    Immunization History  Administered Date(s) Administered  . Influenza-Unspecified 09/27/2016  . Tdap 06/16/2014     Past Medical History:  Diagnosis Date  . Complication of anesthesia    ? "problem with epidural"  back pain  . Depression with anxiety   . Migraine   . Vaginal Pap smear, abnormal    No Known Allergies Past Surgical History:  Procedure Laterality Date  . WISDOM TOOTH EXTRACTION     Family History  Problem Relation Age of Onset  . Asthma Son   . Hyperlipidemia Maternal Grandmother   . Hypertension Maternal Grandmother   . Diabetes Maternal Grandfather   . Hyperlipidemia Maternal Grandfather   . Rectal cancer Maternal Aunt   . Breast cancer Maternal Aunt    Social History   Socioeconomic History  . Marital status: Married    Spouse name: Not on file  . Number of children: 3  . Years of education: doctorate  . Highest education level: Not on file  Occupational History  . Occupation: Software engineer  Social Needs  . Financial resource strain: Not on file  . Food insecurity:    Worry: Not on file    Inability: Not on file  . Transportation needs:    Medical: Not on file  Non-medical: Not on file  Tobacco Use  . Smoking status: Never Smoker  . Smokeless tobacco: Never Used  Substance and Sexual Activity  . Alcohol use: No  . Drug use: No  . Sexual activity: Yes    Birth control/protection: Pill  Lifestyle  . Physical activity:    Days per week: Not on file    Minutes per session: Not on file  . Stress: Not on file  Relationships  . Social connections:    Talks on phone: Not on file    Gets together: Not on file    Attends religious service: Not on file    Active member of club or organization: Not on file    Attends  meetings of clubs or organizations: Not on file    Relationship status: Not on file  . Intimate partner violence:    Fear of current or ex partner: Not on file    Emotionally abused: Not on file    Physically abused: Not on file    Forced sexual activity: Not on file  Other Topics Concern  . Not on file  Social History Narrative   Married. 3 children.    Doctorate degree, works as a Software engineer at [employer].    Wears a bike helmet and seat belt.    Smoke alarm in the home.    Firearms locked in the home.    Feels safe in her relationships.       Allergies as of 06/14/2018   No Known Allergies     Medication List        Accurate as of 06/14/18  9:17 AM. Always use your most recent med list.          ESTARYLLA PO Take by mouth.   LORazepam 0.5 MG tablet Commonly known as:  ATIVAN Take 1 tablet (0.5 mg total) by mouth as needed for anxiety.   omeprazole 20 MG capsule Commonly known as:  PRILOSEC Take 1 capsule (20 mg total) by mouth daily.       All past medical history, surgical history, allergies, family history, immunizations andmedications were updated in the EMR today and reviewed under the history and medication portions of their EMR.     No results found for this or any previous visit (from the past 2160 hour(s)).  US Breast Ltd Uni Right Inc Axilla  Result Date: 09/21/2017 CLINICAL DATA:  The patient's physician feels a lump in the lower inner quadrant of the right breast and the patient feels burning. EXAM: 2D DIGITAL DIAGNOSTIC BILATERAL MAMMOGRAM WITH CAD AND ADJUNCT TOMO ULTRASOUND RIGHT BREAST COMPARISON:  Previous exam(s). ACR Breast Density Category b: There are scattered areas of fibroglandular density. FINDINGS: No suspicious masses, calcifications, or distortion seen in either breast. Mammographic images were processed with CAD. On physical exam, no suspicious lumps are identified. Targeted ultrasound is performed, showing no sonographic abnormalities.  IMPRESSION: No mammographic or sonographic evidence of malignancy. RECOMMENDATION: Treatment of the patient's symptoms should be based on clinical and physical exam given the lack of imaging findings. Recommend annual screening mammography beginning at the age of 12. I have discussed the findings and recommendations with the patient. Results were also provided in writing at the conclusion of the visit. If applicable, a reminder letter will be sent to the patient regarding the next appointment. BI-RADS CATEGORY  1: Negative. Electronically Signed   By: Dorise Bullion III M.D   On: 09/21/2017 11:23   Mm Diag Breast Tomo Bilateral  Result  Date: 09/21/2017 CLINICAL DATA:  The patient's physician feels a lump in the lower inner quadrant of the right breast and the patient feels burning. EXAM: 2D DIGITAL DIAGNOSTIC BILATERAL MAMMOGRAM WITH CAD AND ADJUNCT TOMO ULTRASOUND RIGHT BREAST COMPARISON:  Previous exam(s). ACR Breast Density Category b: There are scattered areas of fibroglandular density. FINDINGS: No suspicious masses, calcifications, or distortion seen in either breast. Mammographic images were processed with CAD. On physical exam, no suspicious lumps are identified. Targeted ultrasound is performed, showing no sonographic abnormalities. IMPRESSION: No mammographic or sonographic evidence of malignancy. RECOMMENDATION: Treatment of the patient's symptoms should be based on clinical and physical exam given the lack of imaging findings. Recommend annual screening mammography beginning at the age of 72. I have discussed the findings and recommendations with the patient. Results were also provided in writing at the conclusion of the visit. If applicable, a reminder letter will be sent to the patient regarding the next appointment. BI-RADS CATEGORY  1: Negative. Electronically Signed   By: Dorise Bullion III M.D   On: 09/21/2017 11:23     ROS: 14 pt review of systems performed and negative (unless mentioned  in an HPI)  Objective: BP 107/72 (BP Location: Right Arm, Patient Position: Sitting, Cuff Size: Large)   Pulse 65   Temp 98.2 F (36.8 C)   Resp 20   Ht '5\' 2"'$  (1.575 m)   Wt 164 lb 8 oz (74.6 kg)   LMP 05/22/2018   SpO2 99%   BMI 30.09 kg/m  Gen: Afebrile. No acute distress. Nontoxic in appearance, well-developed, well-nourished,  overweight HENT: AT. [place]. Bilateral TM visualized and normal in appearance, normal external auditory canal. MMM, no oral lesions, adequate dentition. Bilateral nares within normal limits. Throat without erythema, ulcerations or exudates. no Cough on exam, no hoarseness on exam. Eyes:Pupils Equal Round Reactive to light, Extraocular movements intact,  Conjunctiva without redness, discharge or icterus. Neck/lymp/endocrine: Supple,no lymphadenopathy, no thyromegaly CV: RRR no murmur, no edema, +2/4 P posterior tibialis pulses. no carotid bruits. No JVD. Chest: CTAB, no wheeze, rhonchi or crackles. normal Respiratory effort. good Air movement. Abd: Soft. flat. NTND. BS present. no Masses palpated. No hepatosplenomegaly. No rebound tenderness or guarding. Skin: no rashes, purpura or petechiae. Warm and well-perfused. Skin intact. Neuro/Msk:  Normal gait. PERLA. EOMi. Alert. Oriented x3.  Cranial nerves II through XII intact. Muscle strength 5/5 upper/lower extremity. DTRs equal bilaterally. Psych: Normal affect, dress and demeanor. Normal speech. Normal thought content and judgment.   No exam data present  Assessment/plan: Nicole Meza is a 32 y.o. female present for CPE. Encounter for long-term current use of medication - Comp Met (CMET) Screening for diabetes mellitus - HgB A1c Obesity (BMI 30-39.9) - Lipid panel Screening for deficiency anemia - CBC w/Diff Encounter for preventive health examination Patient was encouraged to exercise greater than 150 minutes a week. Patient was encouraged to choose a diet filled with fresh fruits and vegetables, and  lean meats. AVS provided to patient today for education/recommendation on gender specific health and safety maintenance. Colonoscopy:  had colonoscopy/EGD for bleeding 04/19/2018. Internal hemorrhoid noted only.  follow-up at 59.  by Dr. Silverio Decamp Mammogram: routine screen at 39 , GYN: Dr. Philis Pique, had a mammogram 09/21/2017--> return to routine screens.  Cervical cancer screening: last pap: 10/22/2017, results: negative, completed by: Dr. Philis Pique.  Immunizations: tdap UTD 2015, Influenza UTD 2018 (encouraged yearly) Infectious disease screening: HIV completed 2014 DEXA: routine screen   Return in about 1 year (around 06/15/2019)  for CPE.  Electronically signed by: Howard Pouch, DO [place]

## 2018-06-14 NOTE — Telephone Encounter (Signed)
Please inform patient the following information: Her labs all look good.  We briefly discussed her concern over her weight and management. If she would like a referral to the weight loss clinic, I would be happy to place that for her.

## 2018-06-14 NOTE — Patient Instructions (Signed)

## 2018-06-15 NOTE — Telephone Encounter (Signed)
Left detailed message with results and instructions on patient voice mail per DPR 

## 2018-10-20 ENCOUNTER — Encounter (INDEPENDENT_AMBULATORY_CARE_PROVIDER_SITE_OTHER): Payer: Self-pay

## 2018-12-26 DIAGNOSIS — H10013 Acute follicular conjunctivitis, bilateral: Secondary | ICD-10-CM | POA: Diagnosis not present

## 2019-06-06 ENCOUNTER — Telehealth: Payer: 59 | Admitting: Physician Assistant

## 2019-06-06 DIAGNOSIS — H9209 Otalgia, unspecified ear: Secondary | ICD-10-CM | POA: Diagnosis not present

## 2019-06-06 MED ORDER — CIPROFLOXACIN-DEXAMETHASONE 0.3-0.1 % OT SUSP: OTIC | 0 refills | Status: DC

## 2019-06-06 MED FILL — CIPRODEX OTIC SUSPENSION: 0.3-0.1 | 7 days supply | Qty: 8 | Fill #0

## 2019-06-06 NOTE — Progress Notes (Signed)
E Visit for Swimmer's Ear  We are sorry that you are not feeling well. Here is how we plan to help!  Based on what you have shared with me it looks like you have swimmers ear. Swimmer's ear is a redness or swelling, irritation, or infection of your outer ear canal.These symptoms usually occur within a few days of swimming.Your ear canal is a tube that goes from the opening of the ear to the eardrum.When water stays in your ear canal, germs can grow.This is a painful condition that often happens to children and swimmers of all ages.It is not contagious and oral antibiotics are not required to treat uncomplicated swimmer's ear.The usual symptoms include: Itching inside the ear, Redness or a sense of swelling in the ear, Pain when the ear is tugged on when pressure is placed on the ear, Pus draining from the infected ear. I have prescribed ciprodex otic.Please use as instructed on the bottle.    In certain cases swimmer's ear may progress to a more serious bacterial infection of the middle or inner ear.If you have a fever 102 and up and significantly worsening symptoms, this could indicate a more serious infection moving to the middle/inner and needs face to face evaluation in an office by a provider.  Your symptoms should improve over the next 3 days and should resolve in about 7 days.  HOME CARE:  Wash your hands frequently. Do not place the tip of the bottle on your ear or touch it with your fingers. You can take Acetominophen 650 mg every 4-6 hours as needed for pain.If pain is severe or moderate, you can apply a heating pad (set on low) or hot water bottle (wrapped in a towel) to outer ear for 20 minutes.This will also increase drainage. Avoid ear plugs Do not use Q-tips After showers, help the water run out by tilting your head to one side.  GET HELP RIGHT AWAY IF:  Fever is over 102.2 degrees. You develop progressive ear pain or hearing loss. Ear symptoms persist  longer than 3 days after treatment.  MAKE SURE YOU:  Understand these instructions. Will watch your condition. Will get help right away if you are not doing well or get worse.  TO PREVENT SWIMMER'S EAR: Use a bathing cap or custom fitted swim molds to keep your ears dry. Towel off after swimming to dry your ears. Tilt your head or pull your earlobes to allow the water to escape your ear canal. If there is still water in your ears, consider using a hairdryer on the lowest setting.  Thank you for choosing an e-visit. Your e-visit answers were reviewed by a board certified advanced clinical practitioner to complete your personal care plan. Depending upon the condition, your plan could have included both over the counter or prescription medications. Please review your pharmacy choice. Be sure that the pharmacy you have chosen is open so that you can pick up your prescription now.If there is a problem you may message your provider in [employer] to have the prescription routed to another pharmacy. Your safety is important to [place]. If you have drug allergies check your prescription carefully.  For the next 24 hours, you can use MyChart to ask questions about today's visit, request a non-urgent call back, or ask for a work or school excuse from your e-visit provider. You will get an email in the next two days asking about your experience. I hope that your e-visit has been valuable and will speed your recovery.  A total of 5-10 minutes was spent evaluating this patients questionnaire and formulating a plan of care.

## 2019-06-20 DIAGNOSIS — Z6829 Body mass index (BMI) 29.0-29.9, adult: Secondary | ICD-10-CM | POA: Diagnosis not present

## 2019-06-20 DIAGNOSIS — Z1151 Encounter for screening for human papillomavirus (HPV): Secondary | ICD-10-CM | POA: Diagnosis not present

## 2019-06-20 DIAGNOSIS — Z01419 Encounter for gynecological examination (general) (routine) without abnormal findings: Secondary | ICD-10-CM | POA: Diagnosis not present

## 2019-09-12 ENCOUNTER — Encounter: Payer: 59 | Admitting: Family Medicine

## 2019-11-17 DIAGNOSIS — J3089 Other allergic rhinitis: Secondary | ICD-10-CM | POA: Diagnosis not present

## 2019-11-17 DIAGNOSIS — Z20828 Contact with and (suspected) exposure to other viral communicable diseases: Secondary | ICD-10-CM | POA: Diagnosis not present

## 2019-12-08 DIAGNOSIS — H5213 Myopia, bilateral: Secondary | ICD-10-CM | POA: Diagnosis not present

## 2019-12-12 ENCOUNTER — Ambulatory Visit (INDEPENDENT_AMBULATORY_CARE_PROVIDER_SITE_OTHER): Payer: 59 | Admitting: Family Medicine

## 2019-12-12 ENCOUNTER — Encounter: Payer: Self-pay | Admitting: Family Medicine

## 2019-12-12 ENCOUNTER — Other Ambulatory Visit: Payer: Self-pay

## 2019-12-12 VITALS — Ht 63.0 in | Wt 160.0 lb

## 2019-12-12 DIAGNOSIS — F418 Other specified anxiety disorders: Secondary | ICD-10-CM

## 2019-12-12 MED ORDER — VENLAFAXINE HCL ER 37.5 MG PO CP24: 37.5000 mg | ORAL_CAPSULE | Freq: Every day | ORAL | 1 refills | Status: DC

## 2019-12-12 MED ORDER — LORAZEPAM 0.5 MG PO TABS: 0.5000 mg | ORAL_TABLET | ORAL | 2 refills | Status: DC | PRN

## 2019-12-12 MED FILL — LORazepam 0.5 MG TABS: 0.5 | 30 days supply | Qty: 30 | Fill #0

## 2019-12-12 MED FILL — VENLAFAXINE HCL ER 37.5 MG: 37.5 | 30 days supply | Qty: 30 | Fill #0

## 2019-12-12 NOTE — Progress Notes (Signed)
VIRTUAL VISIT VIA VIDEO  I connected with [name] on 12/12/19 at 10:30 AM EST by a video enabled telemedicine application and verified that I am speaking with the correct person using two identifiers. Location patient: Home Location provider: [facility], Office Persons participating in the virtual visit: Patient, Dr. Raoul Pitch and R.Baker, LPN  I discussed the limitations of evaluation and management by telemedicine and the availability of in person appointments. The patient expressed understanding and agreed to proceed.   SUBJECTIVE Chief Complaint  Patient presents with  . Anxiety    Pt needs refills on medicaitons. No complaints.   . Depression    HPI: Nicole Meza is a 33 y.o.  on depression and anxiety.  She reports she uses the Ativan 0.5 mg very infrequently usually half to 1 tab before bed 2-3 times a month.  She states she usually waits 2-3 nights of not being able to sleep well before taking it.  She has been tried on many SSRIs in the past with intolerance.  She states she was on Wellbutrin in the past and was not intolerant to it, but also did not feel it was helpful.  She does endorse some mild increase in irritability and becoming agitated.  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  She reports having difficulty with focus for many years, but it has worsened since working from home.  Her children are also at home secondary to pandemic.  She has been in counseling with Doreene Adas at [facility]. Patient's last menstrual period was 12/04/2019 (exact date).  ROS: See pertinent positives and negatives per HPI.  Patient Active Problem List   Diagnosis Date Noted  . Obesity (BMI 30-39.9) 06/14/2018  . BRBPR (bright red blood per rectum) 03/01/2018  . Depression with anxiety     Social History   Tobacco Use  . Smoking status: Never Smoker  . Smokeless tobacco: Never  Used  Substance Use Topics  . Alcohol use: No    Current Outpatient Medications:  .  LORazepam (ATIVAN) 0.5 MG tablet, Take 1 tablet (0.5 mg total) by mouth as needed for anxiety., Disp: 30 tablet, Rfl: 2 .  venlafaxine XR (EFFEXOR XR) 37.5 MG 24 hr capsule, Take 1 capsule (37.5 mg total) by mouth daily with breakfast., Disp: 30 capsule, Rfl: 1  Current Facility-Administered Medications:  .  0.9 %  sodium chloride infusion, 500 mL, Intravenous, Once, Nandigam, Kavitha V, MD  No Known Allergies GAD 7 : Generalized Anxiety Score 12/12/2019 06/14/2018 03/01/2018  Nervous, Anxious, on Edge 2 0 1  Control/stop worrying 1 0 3  Worry too much - different things 1 0 3  Trouble relaxing 1 0 3  Restless 1 0 1  Easily annoyed or irritable 3 0 3  Afraid - awful might happen 2 0 3  Total GAD 7 Score 11 0 17  Anxiety Difficulty Somewhat difficult - Not difficult at all    Depression screen [facility] 2/9 12/12/2019 06/14/2018 03/01/2018  Decreased Interest 0 0 0  Down, Depressed, Hopeless 0 0 0  PHQ - 2 Score 0 0 0  Altered sleeping 1 - 1  Tired, decreased energy 1 - 1  Change in appetite 1 - 0  Feeling bad or failure about yourself  0 - 1  Trouble concentrating 3 - 0  Moving slowly or fidgety/restless 0 -  0  Suicidal thoughts 0 - 0  PHQ-9 Score 6 - 3  Difficult doing work/chores Very difficult - Not difficult at all    OBJECTIVE: Ht 5\' 3"  (1.6 m)   Wt 160 lb (72.6 kg)   LMP 12/04/2019 (Exact Date)   BMI 28.34 kg/m  Gen: No acute distress. Nontoxic in appearance.  Pleasant, Caucasian female. HENT: AT. [place].  MMM.  Eyes:Pupils Equal Round Reactive to light, Extraocular movements intact,  Conjunctiva without redness, discharge or icterus.  Neuro:  Alert. Oriented x3  Psych: Normal affect, dress and demeanor. Normal speech. Normal thought content and judgment.  ASSESSMENT AND PLAN: Nicole Meza is a 33 y.o. female present for  Depression with anxiety -Discussed different options with her  today to help better control her anxiety and possibly provide her with some added benefits with focus. -It was agreed to try Effexor 37.5 mg daily in the morning.  We will keep at low dose for now until follow-up to ensure she is able to tolerate medication. -Refilled Ativan 0.5 mg daily as needed as well. - [place]CCS database reviewed and appropriate 12/12/19 . Controlled substance contract signed.  Will need renewed next in person visit. - Face-to-face encounter needed in 6 months if requesting refills on Ativan. - continue counseling. (s. Harris) - F/U 4-6 weeks, if tolerating Effexor consider tapering up if needed versus staying on current dose.  > 25 minutes spent with patient, >50% of time spent face to face      Howard Pouch, DO 12/12/2019

## 2020-01-22 MED FILL — VENLAFAXINE HCL ER 37.5 MG: 37.5 | 30 days supply | Qty: 30 | Fill #1

## 2020-06-21 ENCOUNTER — Encounter: Payer: 59 | Admitting: Family Medicine

## 2020-06-21 DIAGNOSIS — Z0289 Encounter for other administrative examinations: Secondary | ICD-10-CM

## 2020-09-10 DIAGNOSIS — Z01419 Encounter for gynecological examination (general) (routine) without abnormal findings: Secondary | ICD-10-CM | POA: Diagnosis not present

## 2020-09-10 DIAGNOSIS — Z6824 Body mass index (BMI) 24.0-24.9, adult: Secondary | ICD-10-CM | POA: Diagnosis not present

## 2020-09-21 ENCOUNTER — Telehealth: Payer: 59 | Admitting: Nurse Practitioner

## 2020-09-21 DIAGNOSIS — N3000 Acute cystitis without hematuria: Secondary | ICD-10-CM

## 2020-09-21 MED ORDER — CEPHALEXIN 500 MG PO CAPS: 500.0000 mg | ORAL_CAPSULE | Freq: Two times a day (BID) | ORAL | 0 refills | Status: DC

## 2020-09-21 NOTE — Progress Notes (Signed)

## 2020-09-25 DIAGNOSIS — Z20828 Contact with and (suspected) exposure to other viral communicable diseases: Secondary | ICD-10-CM | POA: Diagnosis not present

## 2020-10-02 ENCOUNTER — Ambulatory Visit: Payer: 59 | Attending: Internal Medicine

## 2020-10-02 ENCOUNTER — Other Ambulatory Visit (HOSPITAL_COMMUNITY): Payer: Self-pay | Admitting: Internal Medicine

## 2020-10-02 DIAGNOSIS — Z23 Encounter for immunization: Secondary | ICD-10-CM

## 2020-10-02 NOTE — Progress Notes (Signed)
   Covid-19 Vaccination Clinic  Name:  GRACIANNA VINK    MRN: [id number] DOB: 16-Jan-1986  10/02/2020  Ms. Fessenden was observed post Covid-19 immunization for 15 minutes without incident. She was provided with Vaccine Information Sheet and instruction to access the V-Safe system.   Ms. Pierro was instructed to call 911 with any severe reactions post vaccine: Marland Kitchen Difficulty breathing  . Swelling of face and throat  . A fast heartbeat  . A bad rash all over body  . Dizziness and weakness

## 2020-12-04 DIAGNOSIS — N644 Mastodynia: Secondary | ICD-10-CM | POA: Diagnosis not present

## 2020-12-06 ENCOUNTER — Other Ambulatory Visit: Payer: Self-pay | Admitting: Obstetrics and Gynecology

## 2020-12-06 DIAGNOSIS — N644 Mastodynia: Secondary | ICD-10-CM

## 2021-01-03 ENCOUNTER — Other Ambulatory Visit (HOSPITAL_COMMUNITY): Payer: Self-pay

## 2021-01-03 MED FILL — ZOLPIDEM TARTRATE 5 MG TABS: 5 | 5 days supply | Qty: 5 | Fill #0

## 2021-01-03 MED FILL — ONDANSETRON ODT 8 MG TABLET: 8 | 4 days supply | Qty: 12 | Fill #0

## 2021-01-03 MED FILL — CEPHALEXIN 500 MG CAPSULE: 500 | 5 days supply | Qty: 20 | Fill #0

## 2021-01-03 MED FILL — oxyCODONE HCL 5 MG TABS: 5 | 5 days supply | Qty: 30 | Fill #0

## 2021-01-15 ENCOUNTER — Ambulatory Visit
Admission: RE | Admit: 2021-01-15 | Discharge: 2021-01-15 | Disposition: A | Discharge status: Home / Self Care | Payer: 59 | Source: Ambulatory Visit | Attending: Obstetrics and Gynecology | Admitting: Obstetrics and Gynecology

## 2021-01-15 ENCOUNTER — Other Ambulatory Visit: Payer: Self-pay

## 2021-01-15 ENCOUNTER — Ambulatory Visit: Payer: 59

## 2021-01-15 DIAGNOSIS — R928 Other abnormal and inconclusive findings on diagnostic imaging of breast: Secondary | ICD-10-CM | POA: Diagnosis not present

## 2021-01-15 DIAGNOSIS — N644 Mastodynia: Secondary | ICD-10-CM

## 2021-02-18 ENCOUNTER — Other Ambulatory Visit (HOSPITAL_COMMUNITY): Payer: Self-pay

## 2021-02-18 MED FILL — TRETINOIN 0.05 % CREA: 0.05 | 15 days supply | Qty: 45 | Fill #0

## 2021-05-08 IMAGING — MG DIGITAL DIAGNOSTIC BILAT W/ TOMO W/ CAD
9 series · 9 of 29 positions shown · non-contrast
Comparison: Previous exam(s).

CLINICAL DATA: Recent pain to the inferior right breast which has
resolved.

EXAM:
DIGITAL DIAGNOSTIC BILATERAL MAMMOGRAM WITH TOMO AND CAD
TECHNIQUE: Bilateral digital diagnostic mammography and breast tomosynthesis
was performed. Digital images of the breasts were evaluated with
computer-aided detection.

[R MLO synth-2D (1 of 2)]
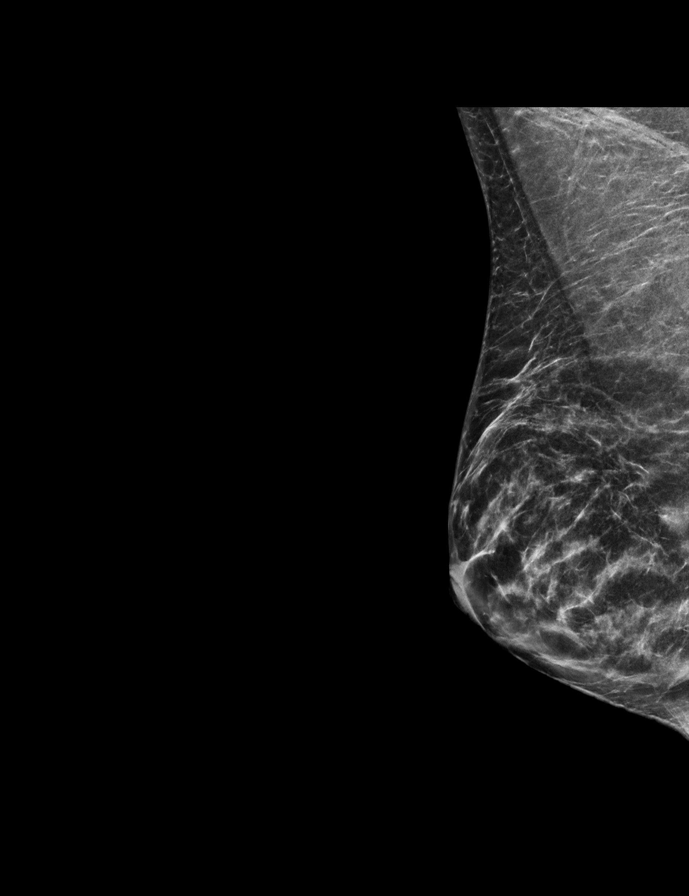

[L MLO synth-2D]
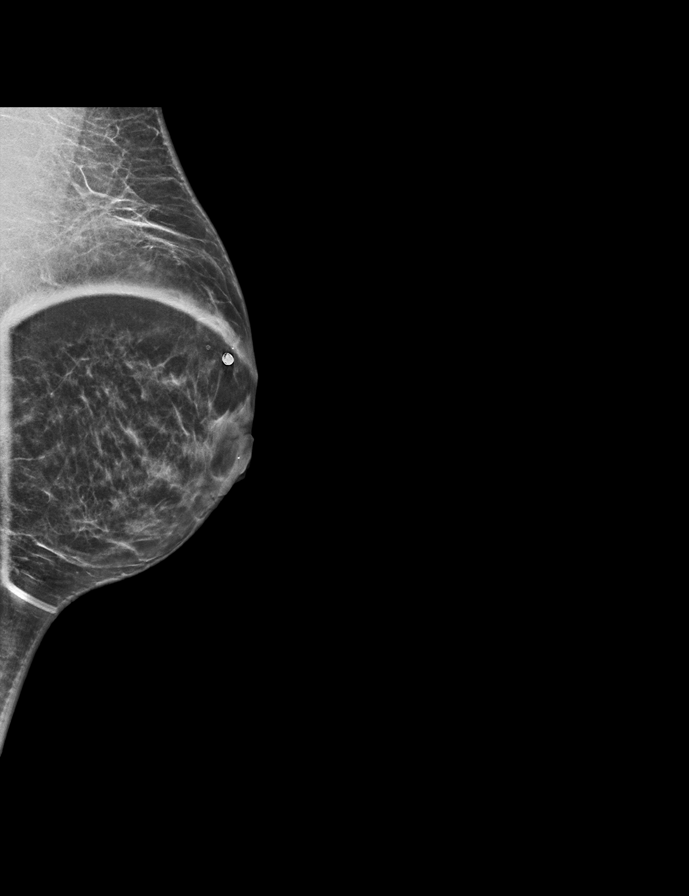

[R MLO synth-2D (2 of 2)]
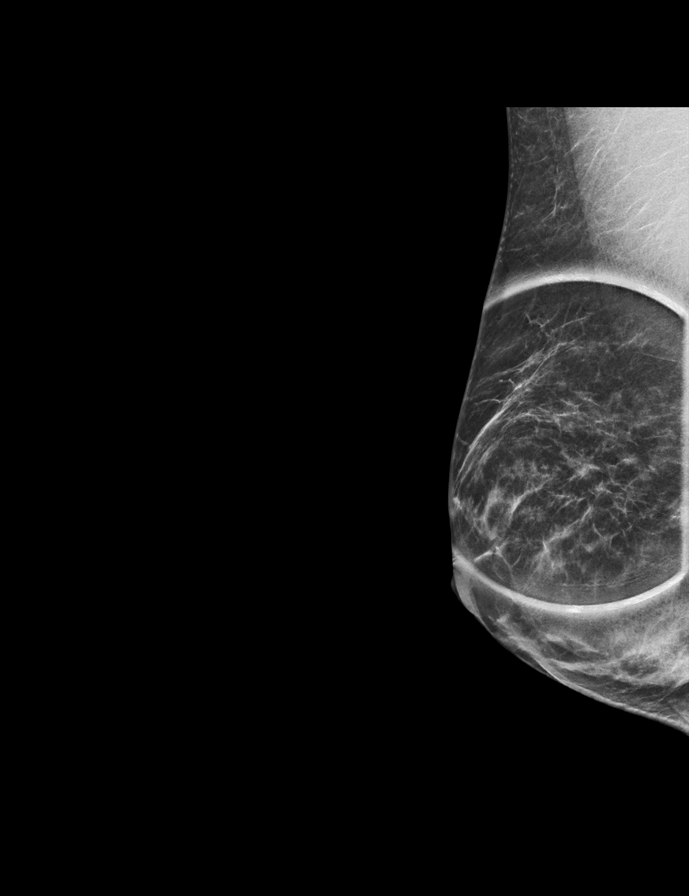

[R CC synth-2D]
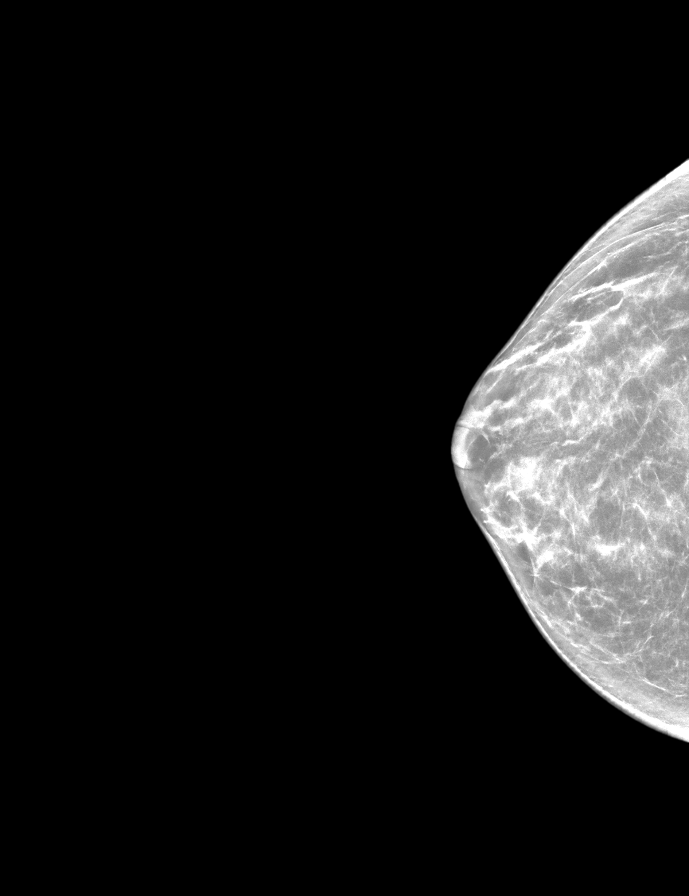

[L CC tomo · tomo slice 25/49.0]
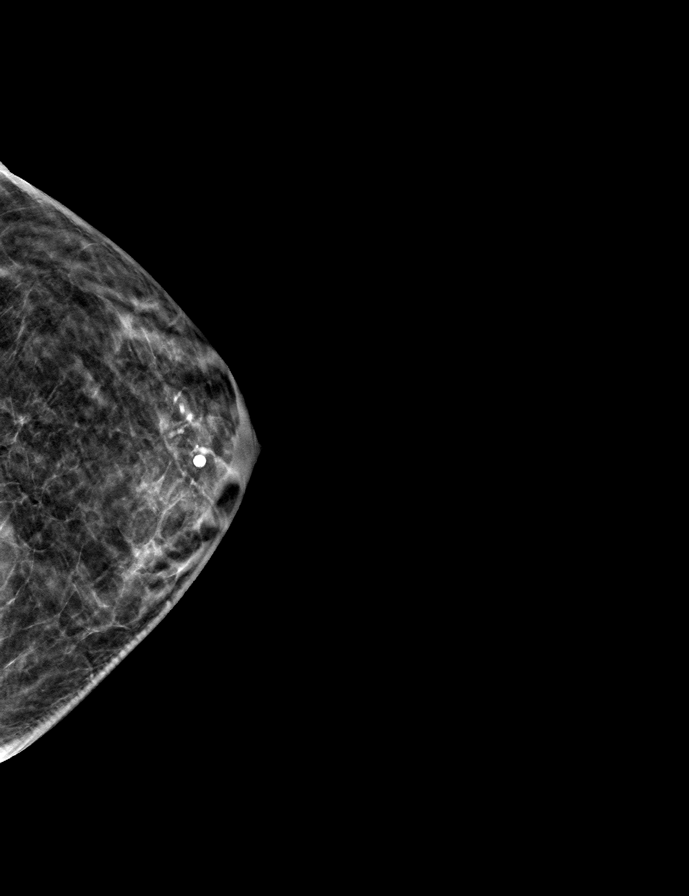

[R MLO tomo (1 of 3) · tomo slice 31/60.0]
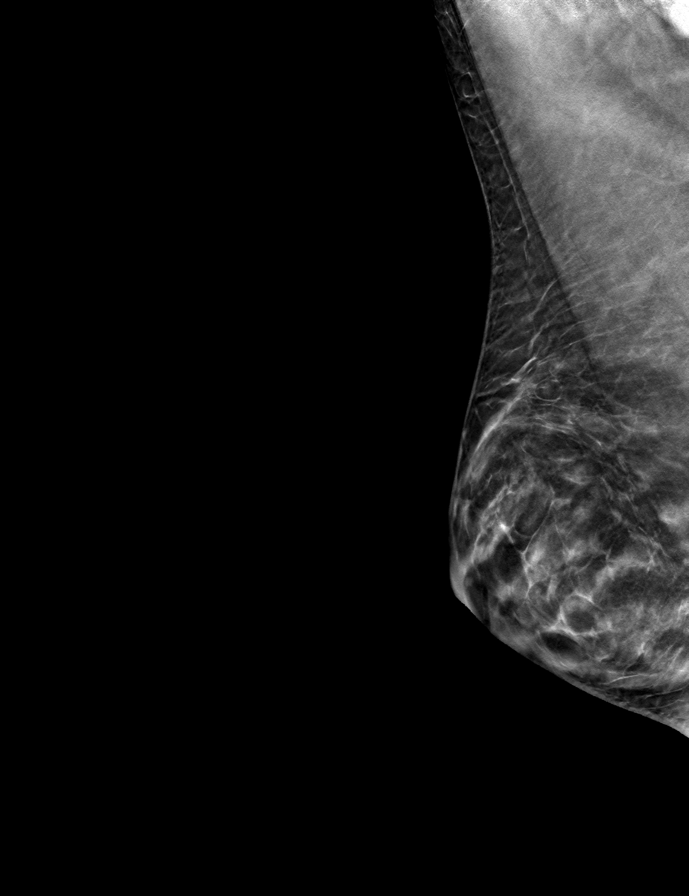

[R MLO tomo (2 of 3) · tomo slice 26/51.0]
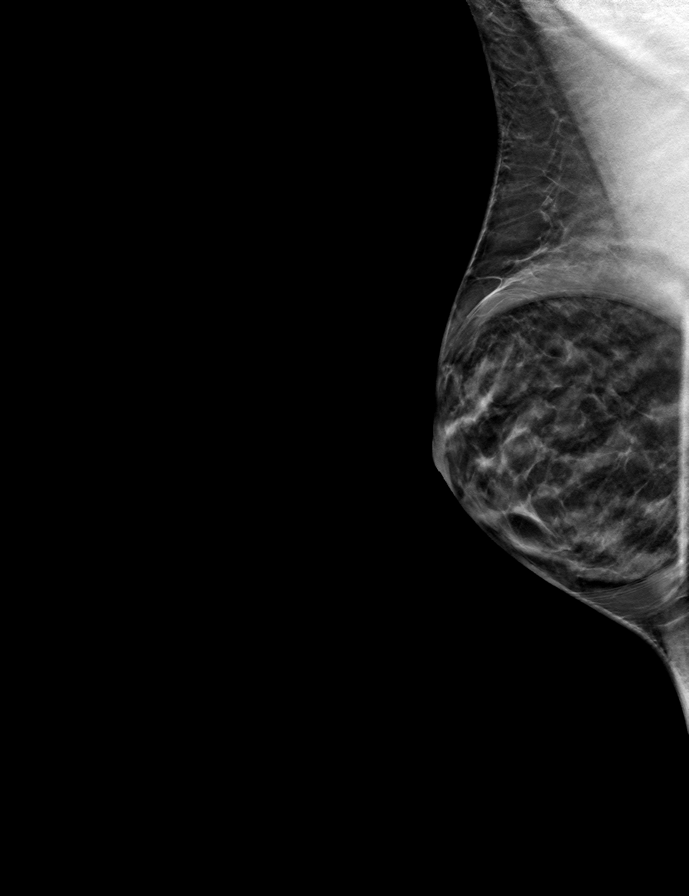

[R MLO tomo (3 of 3) · tomo slice 25/48.0]
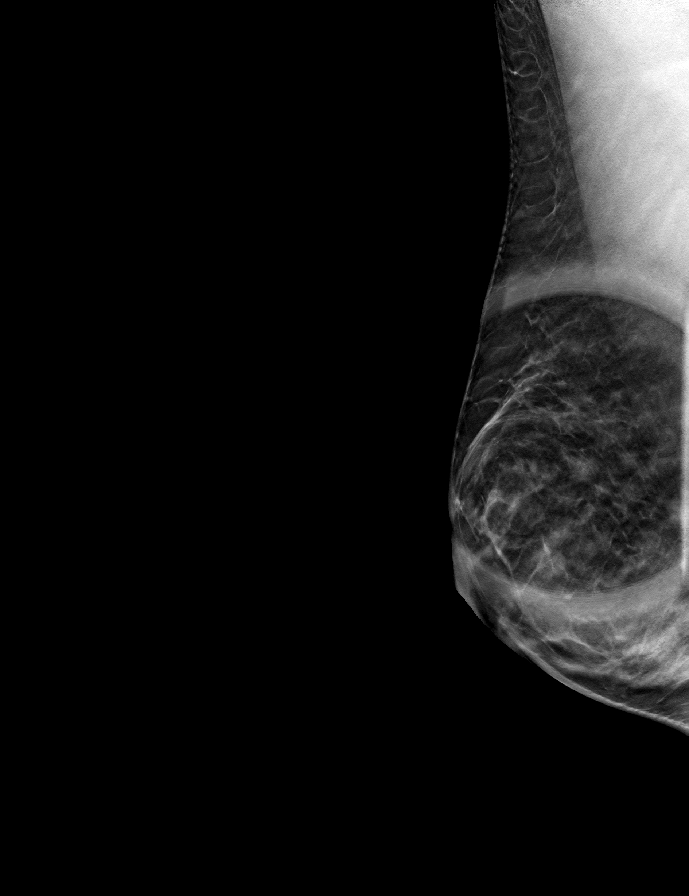

[L MLO tomo · tomo slice 23/44.0]
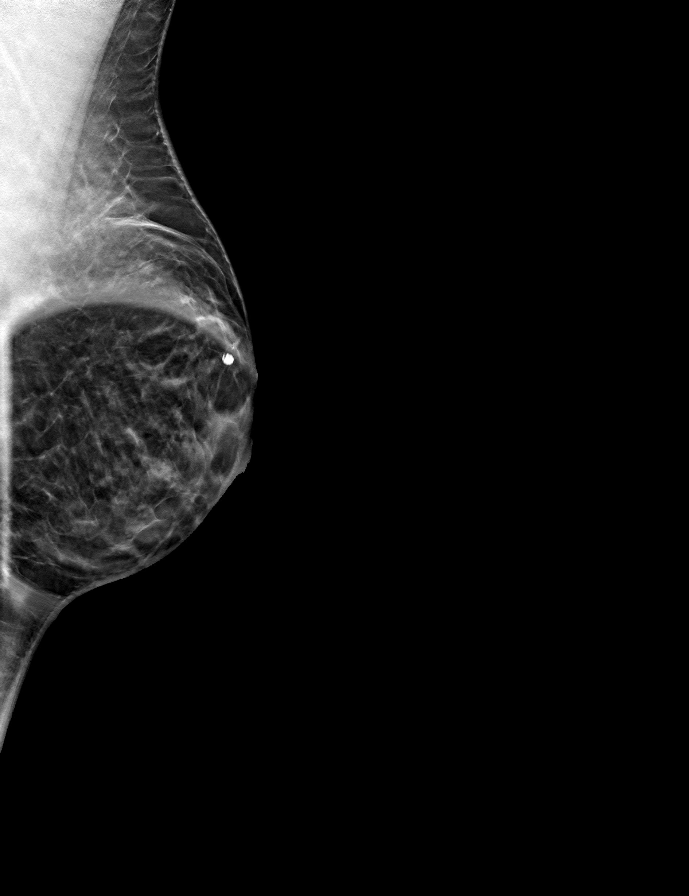

[9 of 29 positions shown; findings below may reference images not displayed]

ACR Breast Density Category b: There are scattered areas of
fibroglandular density.
FINDINGS: The breasts look denser on today's study than the 1967 comparison
which correlates with 50 pounds of weight loss in the interval
reported by the patient. Asymmetries in both breast resolve on
additional imaging. No evidence of malignancy in either breast.
IMPRESSION: No mammographic evidence of malignancy. Increased breast density due
to weight loss.

RECOMMENDATION:
Annual screening mammography beginning at the age of 40.

I have discussed the findings and recommendations with the patient.
If applicable, a reminder letter will be sent to the patient
regarding the next appointment.

BI-RADS CATEGORY  1: Negative.

## 2021-05-13 ENCOUNTER — Other Ambulatory Visit (HOSPITAL_COMMUNITY): Payer: Self-pay

## 2021-08-11 ENCOUNTER — Other Ambulatory Visit (HOSPITAL_COMMUNITY): Payer: Self-pay

## 2021-08-11 MED ORDER — CARESTART COVID-19 HOME TEST VI KIT
PACK | 0 refills | Status: DC
  Filled 2021-08-11: qty 4, 4d supply, fill #0

## 2021-08-21 ENCOUNTER — Other Ambulatory Visit (HOSPITAL_COMMUNITY): Payer: Self-pay

## 2021-08-21 ENCOUNTER — Telehealth: Payer: 59 | Admitting: Family Medicine

## 2021-08-21 DIAGNOSIS — H00014 Hordeolum externum left upper eyelid: Secondary | ICD-10-CM | POA: Diagnosis not present

## 2021-08-21 MED ORDER — NEOMYCIN-POLYMYXIN-HC 3.5-10000-1 OP SUSP
3.0000 [drp] | Freq: Four times a day (QID) | OPHTHALMIC | 0 refills | Status: DC
  Filled 2021-08-21: qty 7.5, 10d supply, fill #0

## 2021-08-21 MED ORDER — BACITRACIN-POLYMYXIN B 500-10000 UNIT/GM OP OINT
1.0000 "application " | TOPICAL_OINTMENT | Freq: Two times a day (BID) | OPHTHALMIC | 0 refills | Status: DC
  Filled 2021-08-21 (×2): qty 3.5, 5d supply, fill #0

## 2021-08-21 MED ORDER — CYCLOBENZAPRINE HCL 10 MG PO TABS
ORAL_TABLET | ORAL | 0 refills | Status: DC
  Filled 2021-08-21: qty 21, 7d supply, fill #0

## 2021-08-21 MED ORDER — OXYCODONE HCL 5 MG PO TABS
ORAL_TABLET | ORAL | 0 refills | Status: DC
  Filled 2021-08-21: qty 30, 5d supply, fill #0

## 2021-08-21 MED ORDER — ONDANSETRON 8 MG PO TBDP
ORAL_TABLET | ORAL | 0 refills | Status: DC
  Filled 2021-08-21: qty 12, 4d supply, fill #0

## 2021-08-21 MED ORDER — CEPHALEXIN 500 MG PO CAPS
500.0000 mg | ORAL_CAPSULE | Freq: Two times a day (BID) | ORAL | 0 refills | Status: DC
  Filled 2021-08-21: qty 20, 10d supply, fill #0

## 2021-08-21 NOTE — Progress Notes (Signed)
  E-Visit for Stye   We are sorry that you are not feeling well. Here is how we plan to help!  Based on what you have shared with me it looks like you have a stye.  A stye is an inflammation of the eyelid.  It is often a red, painful lump near the edge of the eyelid that may look like a boil or a pimple.  A stye develops when an infection occurs at the base of an eyelash.   We have made appropriate suggestions for you based upon your presentation: Your symptoms may indicate an infection of the sclera.  The use of anti-inflammatory and antibiotic eye drops for a week will help resolve this condition.  I have sent in neomycin-polymyxin HC opthalmic suspension, two to three drops in the affected eye every 4 hours.  If your symptoms do not improve over the next two to three days you should be seen in your doctor's office.  HOME CARE:  Wash your hands often! Let the stye open on its own. Don't squeeze or open it. Don't rub your eyes. This can irritate your eyes and let in bacteria.  If you need to touch your eyes, wash your hands first. Don't wear eye makeup or contact lenses until the area has healed.  GET HELP RIGHT AWAY IF:  Your symptoms do not improve. You develop blurred or loss of vision. Your symptoms worsen (increased discharge, pain or redness).   Thank you for choosing an e-visit.  Your e-visit answers were reviewed by a board certified advanced clinical practitioner to complete your personal care plan. Depending upon the condition, your plan could have included both over the counter or prescription medications.  Please review your pharmacy choice. Make sure the pharmacy is open so you can pick up prescription now. If there is a problem, you may contact your provider through [employer] and have the prescription routed to another pharmacy.  Your safety is important to [place]. If you have drug allergies check your prescription carefully.   For the next 24 hours you can use  MyChart to ask questions about today's visit, request a non-urgent call back, or ask for a work or school excuse. You will get an email in the next two days asking about your experience. I hope that your e-visit has been valuable and will speed your recovery.   I provided 5 minutes of non face-to-face time during this encounter for chart review, medication and order placement, as well as and documentation.

## 2021-08-21 NOTE — Addendum Note (Signed)
Addended by: Perlie Mayo on: 08/21/2021 09:02 AM   Modules accepted: Orders

## 2021-08-22 ENCOUNTER — Other Ambulatory Visit (HOSPITAL_COMMUNITY): Payer: Self-pay

## 2021-08-28 DIAGNOSIS — H5213 Myopia, bilateral: Secondary | ICD-10-CM | POA: Diagnosis not present

## 2021-09-08 ENCOUNTER — Other Ambulatory Visit (HOSPITAL_COMMUNITY): Payer: Self-pay

## 2021-09-08 MED ORDER — FLUCONAZOLE 150 MG PO TABS
ORAL_TABLET | ORAL | 1 refills | Status: DC
  Filled 2021-09-08: qty 3, 7d supply, fill #0

## 2021-09-17 ENCOUNTER — Other Ambulatory Visit (HOSPITAL_COMMUNITY): Payer: Self-pay

## 2021-09-17 MED ORDER — METHOCARBAMOL 500 MG PO TABS
ORAL_TABLET | ORAL | 0 refills | Status: DC
  Filled 2021-09-17: qty 15, 5d supply, fill #0

## 2021-11-18 ENCOUNTER — Ambulatory Visit: Payer: 59

## 2021-12-05 ENCOUNTER — Other Ambulatory Visit: Payer: Self-pay

## 2021-12-05 ENCOUNTER — Ambulatory Visit: Payer: 59 | Attending: Internal Medicine

## 2021-12-05 DIAGNOSIS — Z23 Encounter for immunization: Secondary | ICD-10-CM

## 2021-12-10 ENCOUNTER — Other Ambulatory Visit (HOSPITAL_BASED_OUTPATIENT_CLINIC_OR_DEPARTMENT_OTHER): Payer: Self-pay

## 2021-12-10 MED ORDER — PFIZER COVID-19 VAC BIVALENT 30 MCG/0.3ML IM SUSP
INTRAMUSCULAR | 0 refills | Status: DC
  Filled 2021-12-10: qty 0.3, 1d supply, fill #0

## 2021-12-10 NOTE — Progress Notes (Signed)
° °  Covid-19 Vaccination Clinic  Name:  Nicole Meza    MRN: [id number] DOB: [birth year]  12/10/2021  [name] was observed post Covid-19 immunization for 15 minutes without incident. She was provided with Vaccine Information Sheet and instruction to access the V-Safe system.   [name] was instructed to call 911 with any severe reactions post vaccine: Difficulty breathing  Swelling of face and throat  A fast heartbeat  A bad rash all over body  Dizziness and weakness   Immunizations Administered     Name Date Dose VIS Date Route   Pfizer Covid-19 Vaccine Bivalent Booster 12/05/2021  4:28 PM 0.3 mL 08/27/2021 Intramuscular   Manufacturer: [place]   Lot: [id number]   [place]: [phone]

## 2021-12-29 ENCOUNTER — Other Ambulatory Visit (HOSPITAL_COMMUNITY): Payer: Self-pay

## 2021-12-29 MED ORDER — CYCLOSPORINE 0.05 % OP EMUL
OPHTHALMIC | 8 refills | Status: DC
  Filled 2021-12-29: qty 180, 90d supply, fill #0

## 2022-03-24 ENCOUNTER — Other Ambulatory Visit (HOSPITAL_COMMUNITY): Payer: Self-pay

## 2022-03-24 ENCOUNTER — Telehealth: Payer: 59 | Admitting: Family Medicine

## 2022-03-24 DIAGNOSIS — H10021 Other mucopurulent conjunctivitis, right eye: Secondary | ICD-10-CM

## 2022-03-24 MED ORDER — POLYMYXIN B-TRIMETHOPRIM 10000-0.1 UNIT/ML-% OP SOLN
1.0000 [drp] | OPHTHALMIC | 0 refills | Status: DC
  Filled 2022-03-24: qty 10, 30d supply, fill #0

## 2022-03-24 NOTE — Progress Notes (Signed)

## 2022-04-02 DIAGNOSIS — D2339 Other benign neoplasm of skin of other parts of face: Secondary | ICD-10-CM | POA: Diagnosis not present

## 2022-04-02 DIAGNOSIS — D229 Melanocytic nevi, unspecified: Secondary | ICD-10-CM | POA: Diagnosis not present

## 2022-04-13 ENCOUNTER — Telehealth: Payer: 59 | Admitting: Family Medicine

## 2022-04-13 DIAGNOSIS — J019 Acute sinusitis, unspecified: Secondary | ICD-10-CM | POA: Diagnosis not present

## 2022-04-13 DIAGNOSIS — B9689 Other specified bacterial agents as the cause of diseases classified elsewhere: Secondary | ICD-10-CM | POA: Diagnosis not present

## 2022-04-14 ENCOUNTER — Other Ambulatory Visit (HOSPITAL_COMMUNITY): Payer: Self-pay

## 2022-04-14 MED ORDER — AMOXICILLIN-POT CLAVULANATE 875-125 MG PO TABS
1.0000 | ORAL_TABLET | Freq: Two times a day (BID) | ORAL | 0 refills | Status: AC
Start: 2022-04-14 — End: 2022-04-21
  Filled 2022-04-14: qty 14, 7d supply, fill #0

## 2022-04-14 NOTE — Progress Notes (Signed)

## 2022-07-27 DIAGNOSIS — Z124 Encounter for screening for malignant neoplasm of cervix: Secondary | ICD-10-CM | POA: Diagnosis not present

## 2022-07-27 DIAGNOSIS — R3915 Urgency of urination: Secondary | ICD-10-CM | POA: Diagnosis not present

## 2022-07-27 DIAGNOSIS — Z0142 Encounter for cervical smear to confirm findings of recent normal smear following initial abnormal smear: Secondary | ICD-10-CM | POA: Diagnosis not present

## 2022-07-27 DIAGNOSIS — Z113 Encounter for screening for infections with a predominantly sexual mode of transmission: Secondary | ICD-10-CM | POA: Diagnosis not present

## 2022-07-27 DIAGNOSIS — Z01411 Encounter for gynecological examination (general) (routine) with abnormal findings: Secondary | ICD-10-CM | POA: Diagnosis not present

## 2022-07-27 DIAGNOSIS — Z6824 Body mass index (BMI) 24.0-24.9, adult: Secondary | ICD-10-CM | POA: Diagnosis not present

## 2022-07-27 DIAGNOSIS — Z01419 Encounter for gynecological examination (general) (routine) without abnormal findings: Secondary | ICD-10-CM | POA: Diagnosis not present

## 2022-10-12 ENCOUNTER — Other Ambulatory Visit (HOSPITAL_COMMUNITY): Payer: Self-pay

## 2022-10-12 MED ORDER — INFLUENZA VAC SPLIT QUAD 0.5 ML IM SUSY
PREFILLED_SYRINGE | INTRAMUSCULAR | 0 refills | Status: DC
  Filled 2022-10-12: qty 0.5, 1d supply, fill #0

## 2023-06-28 ENCOUNTER — Other Ambulatory Visit (HOSPITAL_COMMUNITY): Payer: Self-pay

## 2023-09-06 ENCOUNTER — Other Ambulatory Visit: Payer: Self-pay

## 2023-09-09 ENCOUNTER — Other Ambulatory Visit (HOSPITAL_COMMUNITY): Payer: Self-pay

## 2023-09-09 MED ORDER — ONDANSETRON 8 MG PO TBDP
8.0000 mg | ORAL_TABLET | Freq: Three times a day (TID) | ORAL | 0 refills | Status: DC | PRN
  Filled 2023-09-09 – 2023-09-20 (×2): qty 30, 10d supply, fill #0

## 2023-09-20 ENCOUNTER — Other Ambulatory Visit (HOSPITAL_COMMUNITY): Payer: Self-pay

## 2023-12-31 ENCOUNTER — Telehealth: Payer: 59 | Admitting: Family Medicine

## 2023-12-31 DIAGNOSIS — B001 Herpesviral vesicular dermatitis: Secondary | ICD-10-CM

## 2023-12-31 MED ORDER — VALACYCLOVIR HCL 1 G PO TABS: 2000.0000 mg | ORAL_TABLET | Freq: Two times a day (BID) | ORAL | 0 refills | Status: AC

## 2023-12-31 NOTE — Progress Notes (Signed)
E-Visit for [employer]  We are sorry that you are not feeling well.  Here is how we plan to help!  Based on what you have shared with me it does look like you have a viral infection.    Most cold sores or fever blisters are small fluid filled blisters around the mouth caused by herpes simplex virus.  The most common strain of the virus causing cold sores is herpes simplex virus 1.  It can be spread by skin contact, sharing eating utensils, or even sharing towels.  Cold sores are contagious to other people until dry. (Approximately 5-7 days).  Wash your hands. You can spread the virus to your eyes through handling your contact lenses after touching the lesions.  Most people experience pain at the sight or tingling sensations in their lips that may begin before the ulcers erupt.  Herpes simplex is treatable but not curable.  It may lie dormant for a long time and then reappear due to stress or prolonged sun exposure.  Many patients have success in treating their cold sores with an over the counter topical called Abreva.  You may apply the cream up to 5 times daily (maximum 10 days) until healing occurs.  If you would like to use an oral antiviral medication to speed the healing of your cold sore, I have sent a prescription to your local pharmacy Valacyclovir 2 gm take one by mouth twice a day for 1 day    HOME CARE:  Wash your hands frequently. Do not pick at or rub the sore. Don't open the blisters. Avoid kissing other people during this time. Avoid sharing drinking glasses, eating utensils, or razors. Do not handle contact lenses unless you have thoroughly washed your hands with soap and warm water! Avoid oral sex during this time.  Herpes from sores on your mouth can spread to your partner's genital area. Avoid contact with anyone who has eczema or a weakened immune system. Cold sores are often triggered by exposure to intense sunlight, use a lip balm containing a sunscreen (SPF 30 or  higher).  GET HELP RIGHT AWAY IF:  Blisters look infected. Blisters occur near or in the eye. Symptoms last longer than 10 days. Your symptoms become worse.  MAKE SURE YOU:  Understand these instructions. Will watch your condition. Will get help right away if you are not doing well or get worse.    Your e-visit answers were reviewed by a board certified advanced clinical practitioner to complete your personal care plan.  Depending upon the condition, your plan could have  Included both over the counter or prescription medications.    Please review your pharmacy choice.  Be sure that the pharmacy you have chosen is open so that you can pick up your prescription now.  If there is a problem you can message your provider in MyChart to have the prescription routed to another pharmacy.    Your safety is important to [place].  If you have drug allergies check our prescription carefully.  For the next 24 hours you can use MyChart to ask questions about today's visit, request a non-urgent call back, or ask for a work or school excuse from your e-visit provider.  You will get an email in the next two days asking about your experience.  I hope that your e-visit has been valuable and will speed your recovery.    have provided 5 minutes of non face to face time during this encounter for chart review  and documentation.

## 2024-08-03 ENCOUNTER — Encounter: Payer: Self-pay | Admitting: Family Medicine

## 2024-08-03 ENCOUNTER — Ambulatory Visit: Admitting: Family Medicine

## 2024-08-03 ENCOUNTER — Other Ambulatory Visit (HOSPITAL_COMMUNITY): Payer: Self-pay

## 2024-08-03 ENCOUNTER — Other Ambulatory Visit: Payer: Self-pay

## 2024-08-03 VITALS — BP 113/74 | HR 68 | Temp 98.0°F | Resp 18 | Ht 62.0 in | Wt 139.2 lb

## 2024-08-03 DIAGNOSIS — F418 Other specified anxiety disorders: Secondary | ICD-10-CM

## 2024-08-03 DIAGNOSIS — Z0000 Encounter for general adult medical examination without abnormal findings: Secondary | ICD-10-CM

## 2024-08-03 DIAGNOSIS — Z136 Encounter for screening for cardiovascular disorders: Secondary | ICD-10-CM | POA: Diagnosis not present

## 2024-08-03 DIAGNOSIS — Z13220 Encounter for screening for lipoid disorders: Secondary | ICD-10-CM | POA: Diagnosis not present

## 2024-08-03 DIAGNOSIS — Z23 Encounter for immunization: Secondary | ICD-10-CM | POA: Diagnosis not present

## 2024-08-03 DIAGNOSIS — Z7689 Persons encountering health services in other specified circumstances: Secondary | ICD-10-CM | POA: Diagnosis not present

## 2024-08-03 DIAGNOSIS — R7302 Impaired glucose tolerance (oral): Secondary | ICD-10-CM

## 2024-08-03 MED ORDER — PROPRANOLOL HCL 10 MG PO TABS
10.0000 mg | ORAL_TABLET | Freq: Every day | ORAL | 0 refills | Status: DC | PRN
  Filled 2024-08-03 (×2): qty 30, 15d supply, fill #0

## 2024-08-03 NOTE — Progress Notes (Signed)
 Complete physical exam and Establish care   Patient: Nicole Meza   DOB: 1986-02-07   37 y.o. Female  MRN: [id number]  Subjective:    No chief complaint on file.   Nicole Meza is a 37 y.o. female who presents today for a complete physical exam. She reports consuming a general diet. Exercises 4/7 days a week. She generally feels well. She reports sleeping poorly. She does  have additional problems to discuss today.   Last pap in 2023 which was normal. 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 0  Number falls in past yr: 0  Injury with Fall? 0  Risk for fall due to : No Fall Risks  Follow up Falls evaluation completed     Most recent depression screenings:    08/03/2024    2:28 PM 12/12/2019   10:29 AM  PHQ 2/9 Scores  PHQ - 2 Score 0 0  PHQ- 9 Score 2 6    Vision:Within last year  Patient Active Problem List   Diagnosis Date Noted   Obesity (BMI 30-39.9) 06/14/2018   BRBPR (bright red blood per rectum) 03/01/2018   Depression with anxiety    Past Medical History:  Diagnosis Date   Complication of anesthesia    ? problem with epidural  back pain   Depression with anxiety    Migraine    Vaginal Pap smear, abnormal    Social History   Tobacco Use   Smoking status: Never    Passive exposure: Never   Smokeless tobacco: Never  Vaping Use   Vaping status: Never Used  Substance Use Topics   Alcohol use: No   Drug use: No   Family Status  Relation Name Status   Mother  Alive   Father  Deceased   Daughter  Alive   Son  Alive   MGM  Alive   MGF  Alive   PGM  Alive   Mat Aunt  (Not Specified)   Mat Aunt  (Not Specified)  No partnership data on file   No Known Allergies    Patient Care Team: Colette Torrence GRADE, MD as PCP - General (Family Medicine) Ladora Ross Lacy Phebe, MD as Referring Physician (Optometry) Sarrah Browning, MD as Consulting Physician  (Obstetrics and Gynecology) Nandigam, Kavitha V, MD as Consulting Physician (Gastroenterology)   Outpatient Medications Prior to Visit  Medication Sig   LORazepam  (ATIVAN ) 0.5 MG tablet Take 1 tablet (0.5 mg total) by mouth as needed for anxiety.   [DISCONTINUED] COVID-19 At Home Antigen Test (CARESTART COVID-19 HOME TEST) KIT Use as directed in package instructions   [DISCONTINUED] COVID-19 mRNA bivalent vaccine, Pfizer, (PFIZER COVID-19 VAC BIVALENT) injection Inject into the muscle.   [DISCONTINUED] cyclobenzaprine  (FLEXERIL ) 10 MG tablet Take 1 tablet by mouth three times a day   [DISCONTINUED] cycloSPORINE  (RESTASIS ) 0.05 % ophthalmic emulsion Instill 1 drop twice a day into both eyes.   [DISCONTINUED] fluconazole  (DIFLUCAN ) 150 MG tablet Take 1 tablet by mouth on day one, 1 tablet on day three, and 1 tablet on day seven.   [DISCONTINUED] influenza vac split quadrivalent PF (FLUARIX) 0.5 ML injection Inject into the muscle.   [DISCONTINUED] methocarbamol  (ROBAXIN ) 500 MG tablet Take 1 tablet by mouth 3 times a day as needed for muscle spasms   [DISCONTINUED] ondansetron  (ZOFRAN -ODT) 8 MG disintegrating tablet Dissolve 1 tablet under  the tongue every 8 hours as needed   [DISCONTINUED] ondansetron  (ZOFRAN -ODT) 8 MG disintegrating tablet Dissolve 1 tablet (8 mg total) by mouth every 8 (eight) hours as needed for nausea/vomiting   [DISCONTINUED] oxyCODONE  (OXY IR/ROXICODONE ) 5 MG immediate release tablet Take 1 tablet by mouth every 4-6 hours as needed for pain   [DISCONTINUED] trimethoprim -polymyxin b  (POLYTRIM ) ophthalmic solution Place 1 drop into the right eye every 4 (four) hours.   [DISCONTINUED] venlafaxine  XR (EFFEXOR  XR) 37.5 MG 24 hr capsule Take 1 capsule (37.5 mg total) by mouth daily with breakfast.   [DISCONTINUED] zolpidem  (AMBIEN ) 5 MG tablet TAKE 1 TABLET BY MOUTH AT BEDTIME AS NEEDED FOR SLEEP AS DIRECTED   Facility-Administered Medications Prior to Visit  Medication Dose  Route Frequency Provider   0.9 %  sodium chloride  infusion  500 mL Intravenous Once Nandigam, Kavitha V, MD    Review of Systems  All other systems reviewed and are negative.         Objective:     BP 113/74   Pulse 68   Temp 98 F (36.7 C) (Oral)   Resp 18   Ht 5' 2 (1.575 m)   Wt 139 lb 3.2 oz (63.1 kg)   LMP 07/30/2024 (Approximate)   SpO2 100%   BMI 25.46 kg/m  BP Readings from Last 3 Encounters:  08/03/24 113/74  06/14/18 107/72  04/19/18 104/69      Physical Exam Vitals and nursing note reviewed.  Constitutional:      Appearance: Normal appearance. She is normal weight.  HENT:     Head: Normocephalic and atraumatic.     Right Ear: Tympanic membrane, ear canal and external ear normal.     Left Ear: Tympanic membrane, ear canal and external ear normal.     Nose: Nose normal.     Mouth/Throat:     Mouth: Mucous membranes are moist.     Pharynx: Oropharynx is clear.  Eyes:     Conjunctiva/sclera: Conjunctivae normal.     Pupils: Pupils are equal, round, and reactive to light.  Cardiovascular:     Rate and Rhythm: Normal rate and regular rhythm.     Pulses: Normal pulses.     Heart sounds: Normal heart sounds.  Pulmonary:     Effort: Pulmonary effort is normal.     Breath sounds: Normal breath sounds.  Abdominal:     General: Abdomen is flat. Bowel sounds are normal.  Skin:    General: Skin is warm.     Capillary Refill: Capillary refill takes less than 2 seconds.  Neurological:     General: No focal deficit present.     Mental Status: She is alert and oriented to person, place, and time. Mental status is at baseline.  Psychiatric:        Mood and Affect: Mood normal.        Behavior: Behavior normal.        Thought Content: Thought content normal.        Judgment: Judgment normal.     No results found for any visits on 08/03/24. Last CBC Lab Results  Component Value Date   WBC 7.0 06/14/2018   HGB 14.2 06/14/2018   HCT 41.4 06/14/2018    MCV 86.7 06/14/2018   MCH 26.5 06/16/2014   RDW 12.5 06/14/2018   PLT 215.0 06/14/2018   Last metabolic panel Lab Results  Component Value Date   GLUCOSE 100 (H) 06/14/2018   NA 139 06/14/2018   K  4.5 06/14/2018   CL 105 06/14/2018   CO2 28 06/14/2018   BUN 14 06/14/2018   CREATININE 0.87 06/14/2018   GFR 80.30 06/14/2018   CALCIUM 9.5 06/14/2018   PROT 6.8 06/14/2018   ALBUMIN 4.4 06/14/2018   BILITOT 0.5 06/14/2018   ALKPHOS 34 (L) 06/14/2018   AST 10 06/14/2018   ALT 11 06/14/2018   Last lipids Lab Results  Component Value Date   CHOL 167 06/14/2018   HDL 43.60 06/14/2018   LDLCALC 106 (H) 06/14/2018   TRIG 85.0 06/14/2018   CHOLHDL 4 06/14/2018   Last hemoglobin A1c Lab Results  Component Value Date   HGBA1C 5.2 06/14/2018        Assessment & Plan:    Routine Health Maintenance and Physical Exam  Immunization History  Administered Date(s) Administered   Influenza, Quadrivalent, Recombinant, Inj, Pf 09/02/2019   Influenza,inj,Quad PF,6+ Mos 10/12/2022   Influenza-Unspecified 09/27/2016, 09/02/2019   PFIZER(Purple Top)SARS-COV-2 Vaccination 10/02/2020   Pfizer Covid-19 Vaccine Bivalent Booster 25yrs & up 12/05/2021   Tdap 06/16/2014    Health Maintenance  Topic Date Due   Hepatitis C Screening  Never done   Hepatitis B Vaccines (1 of 3 - 19+ 3-dose series) Never done   HPV VACCINES (1 - 3-dose SCDM series) Never done   Cervical Cancer Screening (HPV/Pap Cotest)  10/22/2020   COVID-19 Vaccine (3 - 2024-25 season) 08/29/2023   DTaP/Tdap/Td (2 - Td or Tdap) 06/16/2024   INFLUENZA VACCINE  07/28/2024   HIV Screening  Completed   Meningococcal B Vaccine  Aged Out    Discussed health benefits of physical activity, and encouraged her to engage in regular exercise appropriate for her age and condition.  Problem List Items Addressed This Visit   None  No follow-ups on file. Annual physical exam  Encounter to establish care with new  doctor  Encounter for lipid screening for cardiovascular disease -     Lipid panel  Impaired glucose tolerance -     CBC with Differential/Platelet -     Comprehensive metabolic panel with GFR -     Hemoglobin A1c  Need for Tdap vaccination -     Tdap vaccine greater than or equal to 7yo IM   Pt here for CPE.  Screening labs done along with Tdap today    Torrence CINDERELLA Barrier, MD

## 2024-08-03 NOTE — Progress Notes (Signed)
 New Patient Office Visit  Subjective    Patient ID: Nicole Meza, female    DOB: 25-Aug-1986  Age: 38 y.o. MRN: [id number]  CC:  Chief Complaint  Patient presents with   Performance anxiety    HPI Nicole Meza presents to discuss separate condition on top of her CPE today.  Pt reports a hx of performance anxiety. She works for [employer]. 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 She requests a prescription for Propranolol  to help.    Outpatient Encounter Medications as of 08/03/2024  Medication Sig   propranolol  (INDERAL ) 10 MG tablet Take 1-2 tablets (10-20 mg total) by mouth daily as needed (take an hour before event).   [DISCONTINUED] LORazepam  (ATIVAN ) 0.5 MG tablet Take 1 tablet (0.5 mg total) by mouth as needed for anxiety.   [DISCONTINUED] COVID-19 At Home Antigen Test (CARESTART COVID-19 HOME TEST) KIT Use as directed in package instructions   [DISCONTINUED] COVID-19 mRNA bivalent vaccine, Pfizer, (PFIZER COVID-19 VAC BIVALENT) injection Inject into the muscle.   [DISCONTINUED] cyclobenzaprine  (FLEXERIL ) 10 MG tablet Take 1 tablet by mouth three times a day   [DISCONTINUED] cycloSPORINE  (RESTASIS ) 0.05 % ophthalmic emulsion Instill 1 drop twice a day into both eyes.   [DISCONTINUED] fluconazole  (DIFLUCAN ) 150 MG tablet Take 1 tablet by mouth on day one, 1 tablet on day three, and 1 tablet on day seven.   [DISCONTINUED] influenza vac split quadrivalent PF (FLUARIX) 0.5 ML injection Inject into the muscle.   [DISCONTINUED] methocarbamol  (ROBAXIN ) 500 MG tablet Take 1 tablet by mouth 3 times a day as needed for muscle spasms   [DISCONTINUED] ondansetron  (ZOFRAN -ODT) 8 MG disintegrating tablet Dissolve 1 tablet under the tongue every 8 hours as needed   [DISCONTINUED] ondansetron  (ZOFRAN -ODT) 8 MG disintegrating tablet Dissolve 1 tablet (8 mg total) by mouth every 8 (eight) hours as needed for nausea/vomiting    [DISCONTINUED] oxyCODONE  (OXY IR/ROXICODONE ) 5 MG immediate release tablet Take 1 tablet by mouth every 4-6 hours as needed for pain   [DISCONTINUED] trimethoprim -polymyxin b  (POLYTRIM ) ophthalmic solution Place 1 drop into the right eye every 4 (four) hours.   [DISCONTINUED] venlafaxine  XR (EFFEXOR  XR) 37.5 MG 24 hr capsule Take 1 capsule (37.5 mg total) by mouth daily with breakfast.   [DISCONTINUED] zolpidem  (AMBIEN ) 5 MG tablet TAKE 1 TABLET BY MOUTH AT BEDTIME AS NEEDED FOR SLEEP AS DIRECTED   [DISCONTINUED] 0.9 %  sodium chloride  infusion    No facility-administered encounter medications on file as of 08/03/2024.    Past Medical History:  Diagnosis Date   Complication of anesthesia    ? problem with epidural  back pain   Depression with anxiety    Migraine    Vaginal Pap smear, abnormal     Past Surgical History:  Procedure Laterality Date   WISDOM TOOTH EXTRACTION      Family History  Problem Relation Age of Onset   Asthma Son    Hyperlipidemia Maternal Grandmother    Hypertension Maternal Grandmother    Diabetes Maternal Grandfather    Hyperlipidemia Maternal Grandfather    Rectal cancer Maternal Aunt    Breast cancer Maternal Aunt     Social History   Socioeconomic History   Marital status: Married    Spouse name: Not on file   Number of children: 3   Years of education: doctorate   Highest education level: Not on file  Occupational History  Occupation: pharmacist  Tobacco Use   Smoking status: Never    Passive exposure: Never   Smokeless tobacco: Never  Vaping Use   Vaping status: Never Used  Substance and Sexual Activity   Alcohol use: No   Drug use: No   Sexual activity: Yes    Birth control/protection: None  Other Topics Concern   Not on file  Social History Narrative   Married. 3 children.    Doctorate degree, works as a Teacher, early years/pre at [employer].    Wears a bike helmet and seat belt.    Smoke alarm in the home.    Firearms locked in the  home.    Feels safe in her relationships.       Social Drivers of Corporate investment banker Strain: Low Risk  (08/03/2024)   Overall Financial Resource Strain (CARDIA)    Difficulty of Paying Living Expenses: Not hard at all  Food Insecurity: No Food Insecurity (08/03/2024)   Hunger Vital Sign    Worried About Running Out of Food in the Last Year: Never true    Ran Out of Food in the Last Year: Never true  Transportation Needs: No Transportation Needs (08/03/2024)   PRAPARE - Administrator, Civil Service (Medical): No    Lack of Transportation (Non-Medical): No  Physical Activity: Inactive (08/03/2024)   Exercise Vital Sign    Days of Exercise per Week: 0 days    Minutes of Exercise per Session: 0 min  Stress: Stress Concern Present (08/03/2024)   [employer] of Occupational Health - Occupational Stress Questionnaire    Feeling of Stress: Very much  Social Connections: Socially Isolated (08/03/2024)   Social Connection and Isolation Panel    Frequency of Communication with Friends and Family: Never    Frequency of Social Gatherings with Friends and Family: Once a week    Attends Religious Services: Never    Database administrator or Organizations: No    Attends Banker Meetings: Never    Marital Status: Married  Catering manager Violence: Not At Risk (08/03/2024)   Humiliation, Afraid, Rape, and Kick questionnaire    Fear of Current or Ex-Partner: No    Emotionally Abused: No    Physically Abused: No    Sexually Abused: No    Review of Systems  Psychiatric/Behavioral:  The patient is nervous/anxious.   All other systems reviewed and are negative.       Objective    BP 113/74   Pulse 68   Temp 98 F (36.7 C) (Oral)   Resp 18   Ht 5' 2 (1.575 m)   Wt 139 lb 3.2 oz (63.1 kg)   LMP 07/30/2024 (Approximate)   SpO2 100%   BMI 25.46 kg/m   Physical Exam Vitals and nursing note reviewed.  Constitutional:      Appearance: Normal appearance.  She is normal weight.  HENT:     Head: Normocephalic and atraumatic.     Right Ear: External ear normal.     Left Ear: External ear normal.     Nose: Nose normal.     Mouth/Throat:     Mouth: Mucous membranes are moist.     Pharynx: Oropharynx is clear.  Eyes:     Conjunctiva/sclera: Conjunctivae normal.     Pupils: Pupils are equal, round, and reactive to light.  Cardiovascular:     Rate and Rhythm: Normal rate and regular rhythm.     Pulses: Normal pulses.  Heart sounds: Normal heart sounds.  Pulmonary:     Effort: Pulmonary effort is normal.     Breath sounds: Normal breath sounds.  Skin:    General: Skin is warm.     Capillary Refill: Capillary refill takes less than 2 seconds.  Neurological:     General: No focal deficit present.     Mental Status: She is alert and oriented to person, place, and time. Mental status is at baseline.  Psychiatric:        Mood and Affect: Mood normal.        Behavior: Behavior normal.        Thought Content: Thought content normal.        Judgment: Judgment normal.      Assessment & Plan:    Performance anxiety -     Propranolol  HCl; Take 1-2 tablets (10-20 mg total) by mouth daily as needed (take an hour before event).  Dispense: 30 tablet; Refill: 0   Pt with hx of performance anxiety. Start trial of Propranolol  10-20mg  an hour before event. To follow up if condition worsens or dose isn't appropriate. Can follow up via mychart.   Return in about 1 year (around 08/03/2025) for Annual Physical.   Torrence CINDERELLA Barrier, MD

## 2024-08-04 ENCOUNTER — Ambulatory Visit: Payer: Self-pay | Admitting: Family Medicine

## 2024-08-04 LAB — CBC WITH DIFFERENTIAL/PLATELET
Basophils Absolute: 0.1 x10E3/uL (ref 0.0–0.2)
Basos: 1 %
EOS (ABSOLUTE): 0 x10E3/uL (ref 0.0–0.4)
Eos: 0 %
Hematocrit: 43.6 % (ref 34.0–46.6)
Hemoglobin: 13.9 g/dL (ref 11.1–15.9)
Immature Grans (Abs): 0 x10E3/uL (ref 0.0–0.1)
Immature Granulocytes: 0 %
Lymphocytes Absolute: 1.9 x10E3/uL (ref 0.7–3.1)
Lymphs: 25 %
MCH: 28.5 pg (ref 26.6–33.0)
MCHC: 31.9 g/dL (ref 31.5–35.7)
MCV: 89 fL (ref 79–97)
Monocytes Absolute: 0.5 x10E3/uL (ref 0.1–0.9)
Monocytes: 7 %
Neutrophils Absolute: 4.9 x10E3/uL (ref 1.4–7.0)
Neutrophils: 67 %
Platelets: 247 x10E3/uL (ref 150–450)
RBC: 4.88 x10E6/uL (ref 3.77–5.28)
RDW: 12.4 % (ref 11.7–15.4)
WBC: 7.4 x10E3/uL (ref 3.4–10.8)

## 2024-08-04 LAB — LIPID PANEL
Chol/HDL Ratio: 3 ratio (ref 0.0–4.4)
Cholesterol, Total: 162 mg/dL (ref 100–199)
HDL: 54 mg/dL (ref >39)
LDL Chol Calc (NIH): 100 mg/dL — ABNORMAL HIGH (ref 0–99)
Triglycerides: 33 mg/dL (ref 0–149)
VLDL Cholesterol Cal: 8 mg/dL (ref 5–40)

## 2024-08-04 LAB — HEMOGLOBIN A1C
Est. average glucose Bld gHb Est-mCnc: 103 mg/dL
Hgb A1c MFr Bld: 5.2 % (ref 4.8–5.6)

## 2024-08-04 LAB — COMPREHENSIVE METABOLIC PANEL WITH GFR
ALT: 13 IU/L (ref 0–32)
AST: 20 IU/L (ref 0–40)
Albumin: 4.7 g/dL (ref 3.9–4.9)
Alkaline Phosphatase: 44 IU/L (ref 44–121)
BUN/Creatinine Ratio: 14 (ref 9–23)
BUN: 15 mg/dL (ref 6–20)
Bilirubin Total: 0.4 mg/dL (ref 0.0–1.2)
CO2: 22 mmol/L (ref 20–29)
Calcium: 9.5 mg/dL (ref 8.7–10.2)
Chloride: 102 mmol/L (ref 96–106)
Creatinine, Ser: 1.04 mg/dL — ABNORMAL HIGH (ref 0.57–1.00)
Globulin, Total: 2.3 g/dL (ref 1.5–4.5)
Glucose: 97 mg/dL (ref 70–99)
Potassium: 3.9 mmol/L (ref 3.5–5.2)
Sodium: 138 mmol/L (ref 134–144)
Total Protein: 7 g/dL (ref 6.0–8.5)
eGFR: 71 mL/min/1.73 (ref >59)

## 2024-10-25 ENCOUNTER — Other Ambulatory Visit (HOSPITAL_BASED_OUTPATIENT_CLINIC_OR_DEPARTMENT_OTHER): Payer: Self-pay

## 2024-10-25 MED ORDER — FLUZONE 0.5 ML IM SUSY
0.5000 mL | PREFILLED_SYRINGE | Freq: Once | INTRAMUSCULAR | 0 refills | Status: AC
  Filled 2024-10-25: qty 0.5, 1d supply, fill #0

## 2024-12-12 ENCOUNTER — Other Ambulatory Visit: Payer: Self-pay | Admitting: Family Medicine

## 2024-12-12 DIAGNOSIS — F418 Other specified anxiety disorders: Secondary | ICD-10-CM

## 2024-12-13 ENCOUNTER — Other Ambulatory Visit: Payer: Self-pay

## 2024-12-13 ENCOUNTER — Other Ambulatory Visit (HOSPITAL_COMMUNITY): Payer: Self-pay

## 2024-12-13 MED ORDER — PROPRANOLOL HCL 10 MG PO TABS
10.0000 mg | ORAL_TABLET | Freq: Every day | ORAL | 0 refills | Status: AC | PRN
  Filled 2024-12-13: qty 30, 15d supply, fill #0

## 2025-08-06 ENCOUNTER — Encounter: Admitting: Family Medicine
